# Patient Record
Sex: Female | Born: 1975 | Race: White | Hispanic: No | Marital: Married | Smoking: Never smoker
Health system: Southern US, Community
[De-identification: ages and names within clinical notes are randomized; demographics above are authoritative.]

## PROBLEM LIST (undated history)

## (undated) ENCOUNTER — Inpatient Hospital Stay (HOSPITAL_COMMUNITY): Payer: Self-pay

## (undated) DIAGNOSIS — Z789 Other specified health status: Secondary | ICD-10-CM

## (undated) HISTORY — PX: TONSILLECTOMY AND ADENOIDECTOMY: SHX28

## (undated) HISTORY — PX: TYMPANOSTOMY TUBE PLACEMENT: SHX32

## (undated) HISTORY — PX: OTHER SURGICAL HISTORY: SHX169

---

## 1998-08-11 ENCOUNTER — Other Ambulatory Visit: Admission: RE | Admit: 1998-08-11 | Discharge: 1998-08-11 | Payer: Self-pay | Admitting: Family Medicine

## 1999-10-12 ENCOUNTER — Other Ambulatory Visit: Admission: RE | Admit: 1999-10-12 | Discharge: 1999-10-12 | Payer: Self-pay | Admitting: Gynecology

## 2000-10-30 ENCOUNTER — Other Ambulatory Visit: Admission: RE | Admit: 2000-10-30 | Discharge: 2000-10-30 | Payer: Self-pay | Admitting: Gynecology

## 2001-01-17 ENCOUNTER — Observation Stay (HOSPITAL_COMMUNITY): Admission: AD | Admit: 2001-01-17 | Discharge: 2001-01-17 | Payer: Self-pay | Admitting: Gynecology

## 2001-06-26 ENCOUNTER — Inpatient Hospital Stay (HOSPITAL_COMMUNITY): Admission: AD | Admit: 2001-06-26 | Discharge: 2001-06-26 | Payer: Self-pay | Admitting: *Deleted

## 2001-07-19 ENCOUNTER — Inpatient Hospital Stay (HOSPITAL_COMMUNITY): Admission: AD | Admit: 2001-07-19 | Discharge: 2001-07-21 | Payer: Self-pay | Admitting: *Deleted

## 2001-08-30 ENCOUNTER — Other Ambulatory Visit: Admission: RE | Admit: 2001-08-30 | Discharge: 2001-08-30 | Payer: Self-pay | Admitting: Gynecology

## 2002-09-03 ENCOUNTER — Other Ambulatory Visit: Admission: RE | Admit: 2002-09-03 | Discharge: 2002-09-03 | Payer: Self-pay | Admitting: Gynecology

## 2003-03-10 ENCOUNTER — Emergency Department (HOSPITAL_COMMUNITY): Admission: EM | Admit: 2003-03-10 | Discharge: 2003-03-10 | Payer: Self-pay | Admitting: Emergency Medicine

## 2003-03-10 ENCOUNTER — Encounter: Payer: Self-pay | Admitting: Emergency Medicine

## 2003-12-18 ENCOUNTER — Inpatient Hospital Stay (HOSPITAL_COMMUNITY): Admission: AD | Admit: 2003-12-18 | Discharge: 2003-12-21 | Payer: Self-pay | Admitting: Obstetrics and Gynecology

## 2004-02-10 ENCOUNTER — Other Ambulatory Visit: Admission: RE | Admit: 2004-02-10 | Discharge: 2004-02-10 | Payer: Self-pay | Admitting: Obstetrics and Gynecology

## 2005-02-28 ENCOUNTER — Other Ambulatory Visit: Admission: RE | Admit: 2005-02-28 | Discharge: 2005-02-28 | Payer: Self-pay | Admitting: Obstetrics and Gynecology

## 2006-05-20 IMAGING — US US OB LIMITED
1 series · 14 of 23 positions shown · non-contrast
Comparison: none

CLINICAL DATA: 28-year-old female at reported assigned gestational age of 38 weeks 6 days.  Status post fall, striking abdomen.  Evaluate placenta and AFI.

[Series 1: unknown · 0.37mm/px · 14 of 23 slices shown]
[im 1/23]
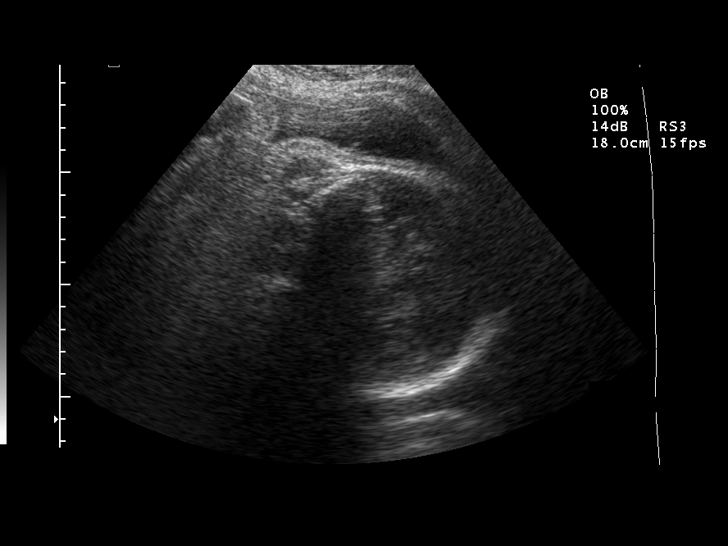
[im 3/23]
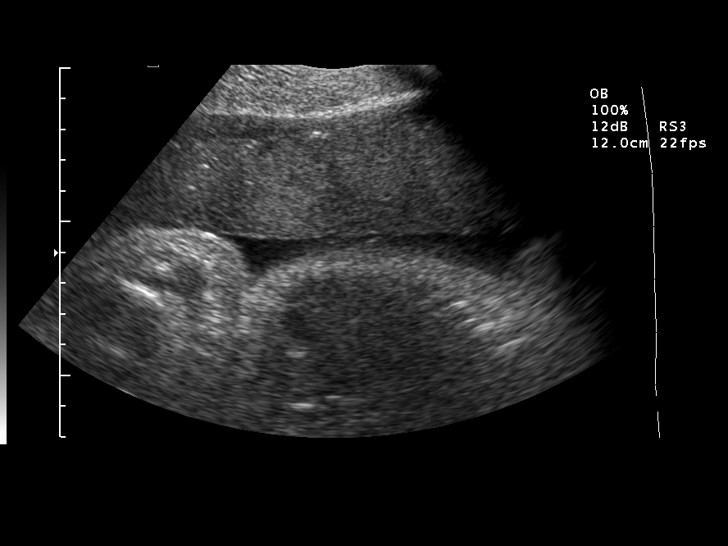
[im 5/23]
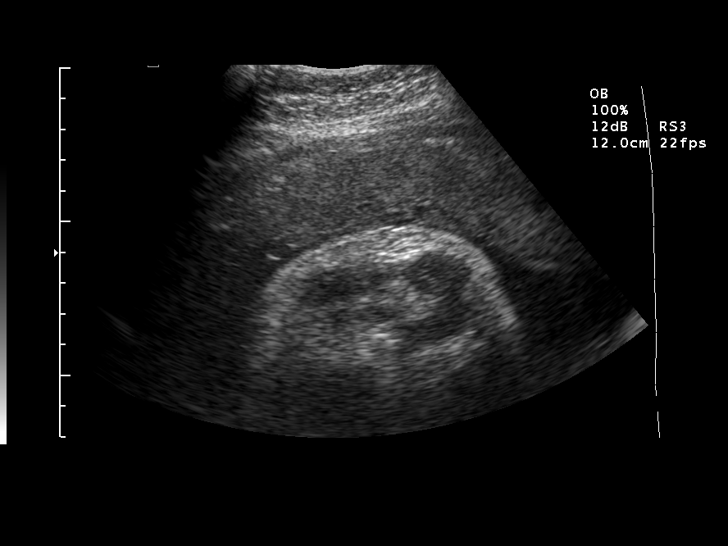
[im 6/23]
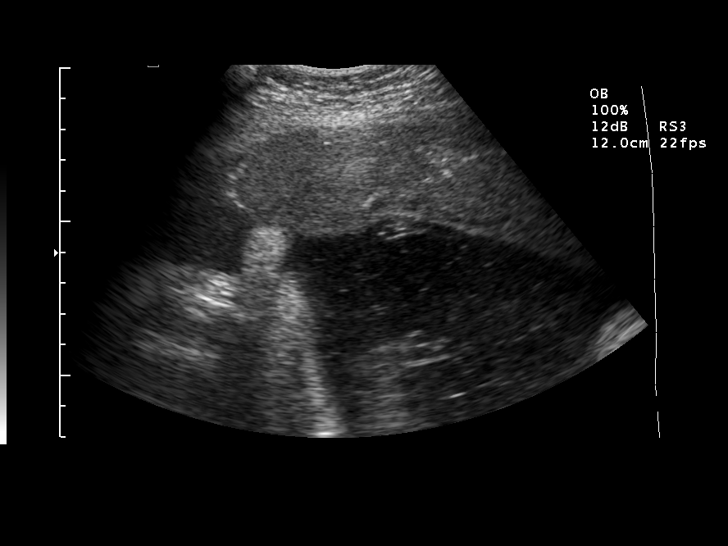
[im 8/23]
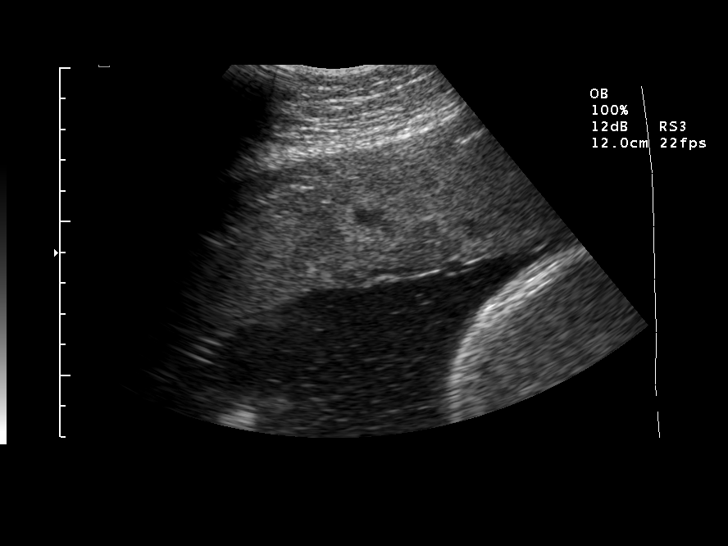
[im 10/23]
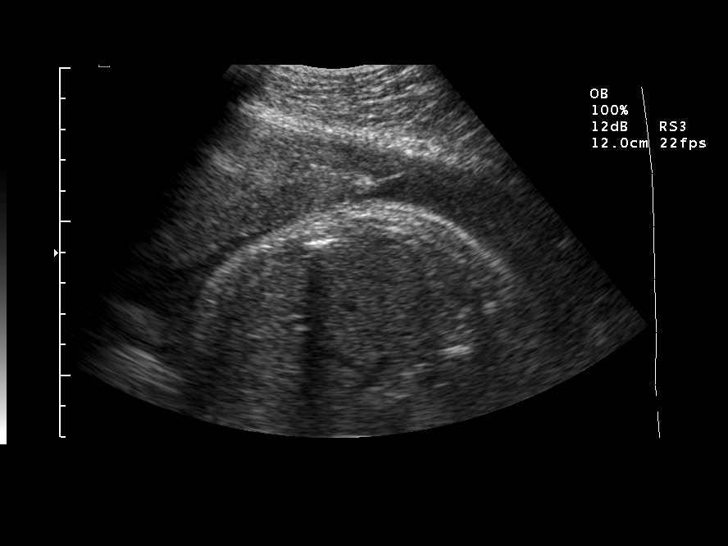
[im 11/23]
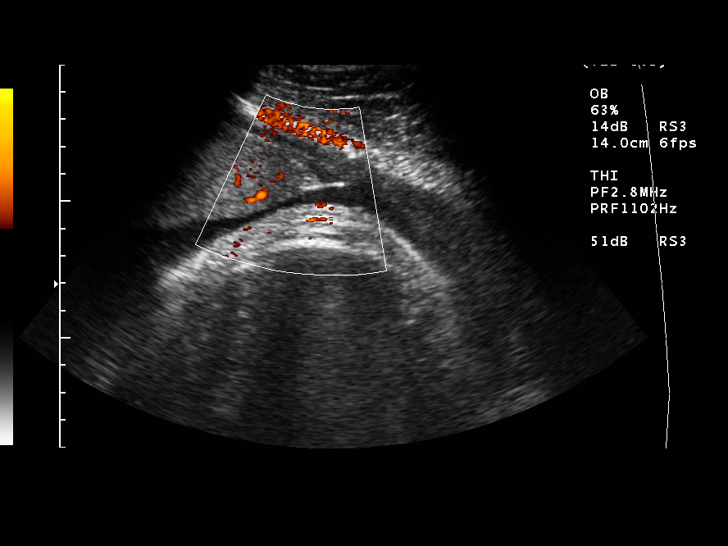
[im 13/23]
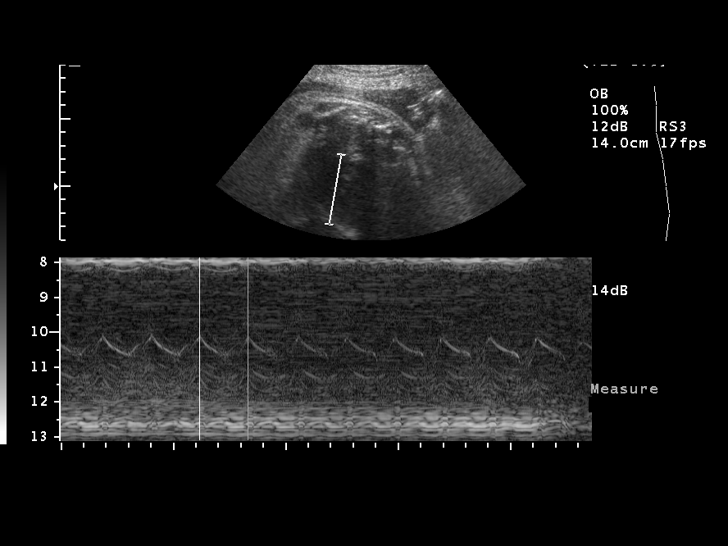
[im 14/23]
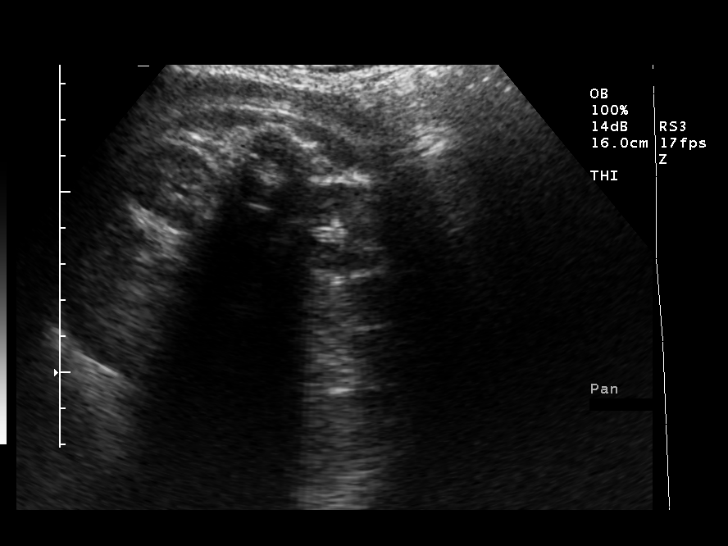
[im 16/23]
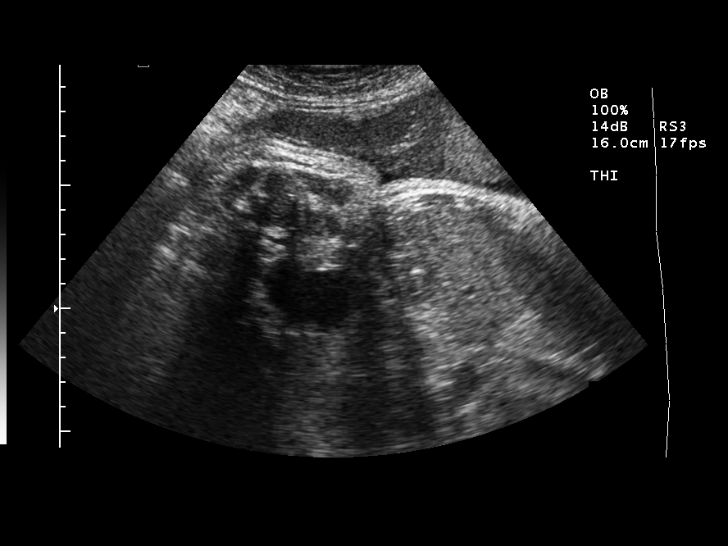
[im 18/23]
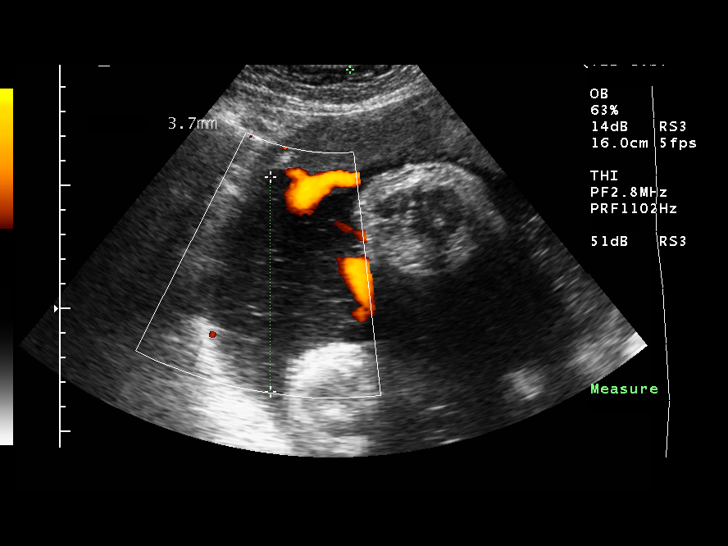
[im 19/23]
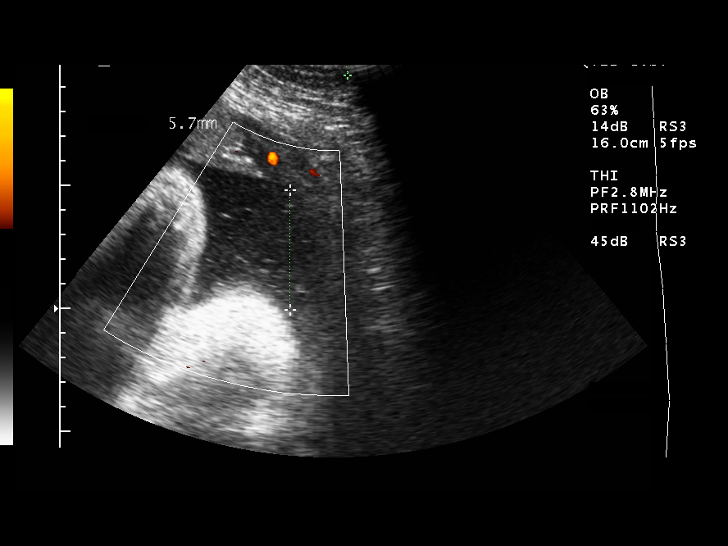
[im 21/23]
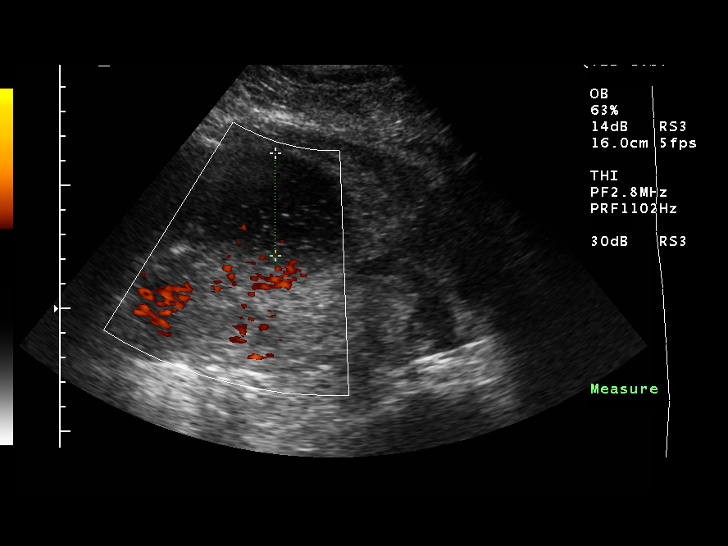
[im 23/23]
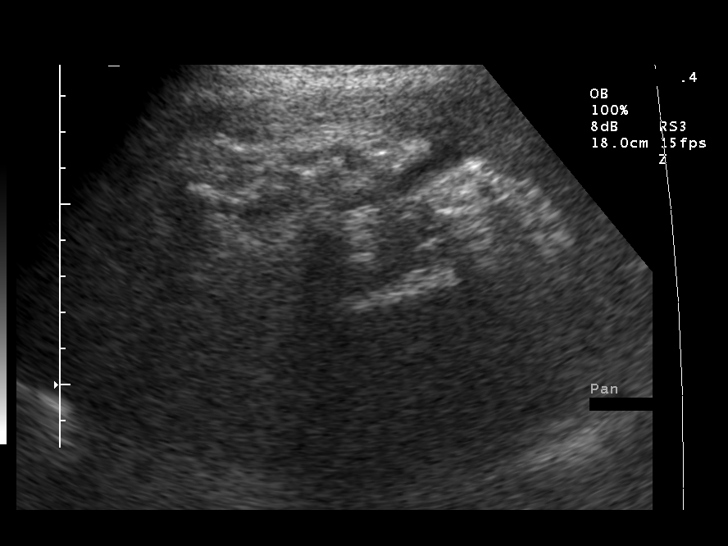

[14 of 23 positions shown; findings below may reference images not displayed]

LIMITED OBSTETRICAL ULTRASOUND
 Number of Fetuses:  1
 Heart Rate:  140 BPM
 Movement:  Yes
 Breathing:  Yes
 Presentation:  Cephalic
 Placental Location:  Anterior 
 Grade:  II
 Previa:  No
 Amniotic Fluid (Subjective):  Normal
 Amniotic Fluid (Objective):  AFI 20.9 cm (3th-93th %ile = 7.2 to 22.6 cm for 39 weeks) 

 Fetal measurements and complete anatomic evaluation were not requested.  The following fetal anatomy was visualized during this exam:  Stomach, three vessel cord, kidneys, and bladder.

 MATERNAL FINDINGS
 Cervix: Not evaluated, >37 weeks.
IMPRESSION: Single living intrauterine fetus in cephalic presentation with subjectively and quantitatively normal amniotic fluid volume.
 No focal placental abnormality is identified.  Placental abruption can be occult at ultrasound.

## 2010-05-28 ENCOUNTER — Encounter: Payer: Self-pay | Admitting: Family Medicine

## 2010-05-28 LAB — CONVERTED CEMR LAB: Pap Smear: ABNORMAL

## 2010-06-25 ENCOUNTER — Encounter: Payer: Self-pay | Admitting: Family Medicine

## 2010-06-25 ENCOUNTER — Ambulatory Visit (INDEPENDENT_AMBULATORY_CARE_PROVIDER_SITE_OTHER): Payer: BC Managed Care – PPO | Admitting: Family Medicine

## 2010-06-25 DIAGNOSIS — K219 Gastro-esophageal reflux disease without esophagitis: Secondary | ICD-10-CM | POA: Insufficient documentation

## 2010-06-25 DIAGNOSIS — R030 Elevated blood-pressure reading, without diagnosis of hypertension: Secondary | ICD-10-CM | POA: Insufficient documentation

## 2010-06-25 DIAGNOSIS — R109 Unspecified abdominal pain: Secondary | ICD-10-CM | POA: Insufficient documentation

## 2010-06-25 DIAGNOSIS — Z8719 Personal history of other diseases of the digestive system: Secondary | ICD-10-CM | POA: Insufficient documentation

## 2010-06-25 DIAGNOSIS — E663 Overweight: Secondary | ICD-10-CM

## 2010-06-25 DIAGNOSIS — J069 Acute upper respiratory infection, unspecified: Secondary | ICD-10-CM | POA: Insufficient documentation

## 2010-06-25 DIAGNOSIS — Z862 Personal history of diseases of the blood and blood-forming organs and certain disorders involving the immune mechanism: Secondary | ICD-10-CM | POA: Insufficient documentation

## 2010-06-25 DIAGNOSIS — Z8522 Personal history of malignant neoplasm of nasal cavities, middle ear, and accessory sinuses: Secondary | ICD-10-CM | POA: Insufficient documentation

## 2010-06-25 DIAGNOSIS — R1013 Epigastric pain: Secondary | ICD-10-CM

## 2010-06-25 DIAGNOSIS — K649 Unspecified hemorrhoids: Secondary | ICD-10-CM | POA: Insufficient documentation

## 2010-06-25 LAB — CONVERTED CEMR LAB
Beta hcg, urine, semiquantitative: NEGATIVE
Glucose, Urine, Semiquant: NEGATIVE
Nitrite: NEGATIVE
Specific Gravity, Urine: 1.02
Urobilinogen, UA: 0.2
WBC Urine, dipstick: NEGATIVE
pH: 6

## 2010-06-28 ENCOUNTER — Encounter: Payer: Self-pay | Admitting: Family Medicine

## 2010-06-28 LAB — CONVERTED CEMR LAB
ALT: <8 units/L (ref 0–35)
AST: 12 units/L (ref 0–37)
Albumin: 4.2 g/dL (ref 3.5–5.2)
Alkaline Phosphatase: 75 units/L (ref 39–117)
BUN: 9 mg/dL (ref 6–23)
Basophils Absolute: 0 10*3/uL (ref 0.0–0.1)
Basophils Relative: 0 % (ref 0–1)
Bilirubin, Direct: 0.1 mg/dL (ref 0.0–0.3)
CO2: 24 meq/L (ref 19–32)
Calcium: 9.4 mg/dL (ref 8.4–10.5)
Chloride: 105 meq/L (ref 96–112)
Cholesterol: 171 mg/dL (ref 0–200)
Creatinine, Ser: 0.78 mg/dL (ref 0.40–1.20)
Eosinophils Absolute: 0 10*3/uL (ref 0.0–0.7)
Eosinophils Relative: 0 % (ref 0–5)
Glucose, Bld: 86 mg/dL (ref 70–99)
HCT: 41.1 % (ref 36.0–46.0)
HDL: 49 mg/dL (ref >39)
Helicobacter Pylori Antibody-IgG: <0.4
Hemoglobin: 13.8 g/dL (ref 12.0–15.0)
Indirect Bilirubin: 0.3 mg/dL (ref 0.0–0.9)
LDL Cholesterol: 107 mg/dL — ABNORMAL HIGH (ref 0–99)
Lymphocytes Relative: 18 % (ref 12–46)
Lymphs Abs: 1.8 10*3/uL (ref 0.7–4.0)
MCHC: 33.6 g/dL (ref 30.0–36.0)
MCV: 91.9 fL (ref 78.0–100.0)
Monocytes Absolute: 0.6 10*3/uL (ref 0.1–1.0)
Monocytes Relative: 6 % (ref 3–12)
Neutro Abs: 7.5 10*3/uL (ref 1.7–7.7)
Neutrophils Relative %: 76 % (ref 43–77)
Platelets: 317 10*3/uL (ref 150–400)
Potassium: 4.9 meq/L (ref 3.5–5.3)
RBC: 4.47 M/uL (ref 3.87–5.11)
RDW: 12.8 % (ref 11.5–15.5)
Sodium: 140 meq/L (ref 135–145)
TSH: 1.531 microintl units/mL (ref 0.350–4.500)
Total Bilirubin: 0.4 mg/dL (ref 0.3–1.2)
Total CHOL/HDL Ratio: 3.5
Total Protein: 7 g/dL (ref 6.0–8.3)
Triglycerides: 77 mg/dL (ref <150)
VLDL: 15 mg/dL (ref 0–40)
WBC: 9.9 10*3/uL (ref 4.0–10.5)

## 2010-06-29 ENCOUNTER — Encounter: Payer: Self-pay | Admitting: *Deleted

## 2010-07-07 NOTE — Letter (Signed)
Summary: Generic Letter  [place] [facility]   [place], [place] [address]   Phone: [phone]  Fax: [phone]    06/28/2010  Catherine Reeves [address] [place], [place]  [address]  [place]   Dear Ms. Catherine Reeves,  Our office has been unsuccessful with reaching you. The MD would like you to know that all your lab results came back normal except there was mild elevation in your cholesterol. MD suggests that you avoid trans fats and consider a fiber supplement. We would like you to recheck your labs in a year.   Please call us if you have any question at [phone].      Sincerely,   Josph Macho RMA

## 2010-07-07 NOTE — Assessment & Plan Note (Signed)
Vital Signs:  Patient profile:   35 year old female Menstrual status:  regular LMP:     05/31/2010 Height:      69.25 inches (175.90 cm) Weight:      227.50 pounds (103.41 kg) BMI:     33.47 O2 Sat:      100 % on Room air Temp:     98.2 degrees F (36.78 degrees C) oral Pulse rate:   73 / minute BP sitting:   142 / 85  (right arm) Cuff size:   regular  Vitals Entered By: Josph Macho RMA (June 25, 2010 11:24 AM)  O2 Flow:  Room air  Serial Vital Signs/Assessments:  Time      Position  BP       Pulse  Resp  Temp     By                     128/80                         Danise Edge MD  CC: Establish new patient/ Under breast to top of legs feel "weird" X 5 days- little cough, bloated/ CF Is Patient Diabetic? No LMP (date): 05/31/2010     Menstrual Status regular Enter LMP: 05/31/2010 Last PAP Result abnormal   History of Present Illness: is a 35 year old Caucasian female in today for an appointment. She is treated a large because of some terminal discomfort. She reports just feeling what she describes as severe it for about 5 days now. She describes a long history of intermittent diarrhea constipation bowel type symptoms. Should symptoms are worse with stress traveling or dietary changes. She's never quite expensive the level of bloating and discomfort she is experiencing now. She did have several loose stool yesterday none today describes as small scant amount of bright blood on the tissue when she wipes she thinks secondary to hemorrhoids she's had since having children. She's had trouble with them off and on relatively infrequently she is experiencing some mild amount of anorexia the last few days which is also somewhat new but denies any nausea, vomiting, fevers, chills. Does acknowledge some left upper quadrant and epigastric discomfort notable over the last few days. She's also complaining of a cough and congestion over the last several days. Cough is nonproductive  and dry, she denies sore throat ear pain. She denies any headache but does have some mild nasal congestion without rhinorrhea. She has taken some OTC cold preparations she believes contain Sudafed over th past several days but has not noted any improvement in symptoms. She denied chest pain, palpitations, shortness of breath, GU complaints at this time. She previously has seen Dr. Deidre Ala physician for women and is maintained on Loestrin 24.  Preventive Screening-Counseling & Management  Alcohol-Tobacco     Smoking Status: never  Caffeine-Diet-Exercise     Does Patient Exercise: yes      Drug Use:  no.    Current Medications (verified): 1)  Loestrin 24 Fe 1-20 Mg-Mcg Tabs (Norethin Ace-Eth Estrad-Fe) .... Once Daily  Allergies (verified): No Known Drug Allergies  Past History:  Past Surgical History: tympanostomy tubes b/l x 3 sets Tonsillectomy and Adendoidectomy Jaw surgery, correction  Family History: Father: 30, colon polyps possibly Mother: 22, A&W, migraines Siblings:  Brother: 71, A&W, migraines Sister: 73, migraines MGM: 21, thyroid disease, HTN, anxiety, dementia MGF: deceased@77 , arthritis, complications from THR, HTN PGM: mid 36s, A&W PGF:  deceased@72 , Hodgkin's Lymphoma, colon polyps Children: daughter: 31yo, A&W son: 6yo, A&W  Social History: Occupation: Therapist, music, Home Schooling Married Never Smoked Alcohol use-yes, rare, special occasions Drug use-no Regular exercise-yes, walks dogs, volleyball, Zumba Wears seat belt No dietary restrictions Smoking Status:  never Occupation:  employed Drug Use:  no Does Patient Exercise:  yes  Review of Systems       The patient complains of anorexia and abdominal pain.  The patient denies fever, weight loss, weight gain, vision loss, decreased hearing, hoarseness, chest pain, syncope, dyspnea on exertion, peripheral edema, prolonged cough, headaches, hemoptysis, melena, hematochezia, severe  indigestion/heartburn, hematuria, incontinence, muscle weakness, suspicious skin lesions, transient blindness, difficulty walking, depression, unusual weight change, abnormal bleeding, and enlarged lymph nodes.    Physical Exam  General:  Well-developed,well-nourished,in no acute distress; alert,appropriate and cooperative throughout examination Head:  Normocephalic and atraumatic without obvious abnormalities. No apparent alopecia or balding. Eyes:  No corneal or conjunctival inflammation noted. EOMI. Perrla. Funduscopic exam benign, without hemorrhages, exudates or papilledema. Vision grossly normal. Ears:  External ear exam shows no significant lesions or deformities.  Otoscopic examination reveals clear canals, tympanic membranes are intact bilaterally without bulging, retraction, inflammation or discharge. Hearing is grossly normal bilaterally. Nose:  External nasal examination shows no deformity or inflammation. Nasal mucosa are pink and moist without lesions or exudates. Mouth:  Oral mucosa and oropharynx without lesions or exudates.  Teeth in good repair. Neck:  No deformities, masses, or tenderness noted. Lungs:  Normal respiratory effort, chest expands symmetrically. Lungs are clear to auscultation, no crackles or wheezes. Heart:  Normal rate and regular rhythm. S1 and S2 normal without gallop, murmur, click, rub or other extra sounds. Abdomen:  Bowel sounds positive,abdomen soft and non-tender without masses, organomegaly or hernias noted. Msk:  No deformity or scoliosis noted of thoracic or lumbar spine.   Pulses:  R and L carotid,dorsalis pedis and posterior tibial pulses are full and equal bilaterally Extremities:  No clubbing, cyanosis, edema, or deformity noted with normal full range of motion of all joints.   Neurologic:  No cranial nerve deficits noted. Station and gait are normal. Plantar reflexes are down-going bilaterally. DTRs are symmetrical throughout. Sensory, motor and  coordinative functions appear intact. Skin:  Intact without suspicious lesions or rashes Cervical Nodes:  No lymphadenopathy noted Psych:  Cognition and judgment appear intact. Alert and cooperative with normal attention span and concentration. No apparent delusions, illusions, hallucinations   Impression & Recommendations:  Problem # 1:  ABDOMINAL PAIN, EPIGASTRIC (ICD-789.06)  Orders: T-CBC w/Diff ([id number]) T-Helicobacter AB - IgG ([id number]) Patient with a long history of intermittent, alternating diarrhea and constipation. Has had increased abdominal fullness, discomfort for about 5 days now. Yesterday she had 3 loose stool and then noted scant BRB on the tissue when she wiped. She has had trouble with hemorrhoids in past and believes these have flared up. She is asked to start Benefiber 2 tsp by mouth two times a day a probiotic and a yogurt daily and increase her clear fluid intake if no improvement will need a AXR to evaluate for possible low grade constipation. Call if symptoms worsen and consider Ranitidine and Tums as needed for epigastric pain and reflux  Problem # 2:  GERD (ICD-530.81)  Her updated medication list for this problem includes:    Ranitidine Hcl 150 Mg Tabs (Ranitidine hcl) .Marland Kitchen... 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   Problem # 4:  OVERWEIGHT (ICD-278.02)  Check a TSH, encouraged her to avoid trans fats, increase exercise, use lean proteins and complex carbs, eat small frequent meals and get 7-8 hours of sleep nightly.  Problem # 5:  Preventive Health Care (ICD-V70.0) Patient has seen Dr Deidre Ala, MD of OB/GYN in past she is encouraged to continue with paps every 1-2 years at that location or here. Patient sent for fasting labs today to establish a baseline and we will  discuss at next visit.  Problem # 6:  ELEVATED BP READING WITHOUT DX HYPERTENSION (ICD-796.2) Improved on repeat but has been taking some OTC cold preparations with Sudafed in them, she is encouraged to avoid Sudafed and Dextromethorphan and we will recheck  bp at next visit.  Problem # 7:  VIRAL URI (ICD-465.9)  Her updated medication list for this problem includes:    Mucinex 600 Mg Xr12h-tab (Guaifenesin) .Marland Kitchen... 1 tab by mouth two times a day x 10 Mild symptoms. increase clear fluids and rest, use Tylenol or Ibuprofen as needed for pain or fever and call if symptoms worsen  Complete Medication List: 1)  Loestrin 24 Fe 1-20 Mg-mcg Tabs (Norethin ace-eth estrad-fe) .... Once daily 2)  Mucinex 600 Mg Xr12h-tab (Guaifenesin) .Marland Kitchen.. 1 tab by mouth two times a day x 10 3)  Ranitidine Hcl 150 Mg Tabs (Ranitidine hcl) .Marland Kitchen.. 1 tab by mouth two times a day as needed reflux  Other Orders: T-Basic Metabolic Panel [id number]) T-Hepatic Function [phone]) T-Lipid Profile [phone]) T-TSH [phone]) Urine Pregnancy Test  (28413) UA Dipstick w/o Micro (manual) (24401)  Patient Instructions: 1)  Please schedule a follow-up appointment in 2 months or sooner if not improving 2)  Avoid fatty and spicy foods in combination especially near bedtime Prescriptions: RANITIDINE HCL 150 MG TABS (RANITIDINE HCL) 1 tab by mouth two times a day as needed reflux  #60 x 1   Entered and Authorized by:   Danise Edge MD   Signed by:   Danise Edge MD on 06/25/2010   Method used:   Faxed to ...       [facility]  [address]  [phone] (retail)       [address]       [place], [place]  [address]       Ph: [phone] or [phone]       Fax: [phone]   RxID:   [id number]    Orders Added: 1)  T-Basic Metabolic Panel [id number] 2)  T-Hepatic Function [phone] 3)  T-Lipid Profile [[id number]] 4)  T-CBC w/Diff [[id number]] 5)  T-Helicobacter AB - IgG [[id number]] 6)  T-TSH [62694-85462] 7)   Urine Pregnancy Test  [81025] 8)  UA Dipstick w/o Micro (manual) [81002] 9)  New Patient Level IV [99204]    Preventive Care Screening  Pap Smear:    Date:  05/28/2010    Results:  abnormal   Last Tetanus Booster:    Date:  05/16/1993    Results:  Historical    Laboratory Results   Urine Tests    Routine Urinalysis   Color: yellow Appearance: Clear Glucose: negative   (Normal Range: Negative) Bilirubin: small   (Normal Range: Negative) Ketone: small (15)   (Normal Range: Negative) Spec. Gravity: 1.020   (Normal Range: 1.003-1.035) Blood: moderate   (Normal Range: Negative) pH: 6.0   (Normal Range:  5.0-8.0) Protein: trace   (Normal Range: Negative) Urobilinogen: 0.2   (Normal Range: 0-1) Nitrite: negative   (Normal Range: Negative) Leukocyte Esterace: negative   (Normal Range: Negative)    Urine HCG: negative

## 2011-05-17 NOTE — L&D Delivery Note (Signed)
Delivery Note At 11:19 AM a viable female was delivered via Vaginal, Spontaneous Delivery OA presentation Apgars 9 9   Placenta status: intact  Cord:  with the following complications: none Cord pH: not obtained  Anesthesia: Epidural  Episiotomy: none Lacerations: first Suture Repair: 3.0 chromic Est. Blood Loss (mL): 300  Mom to postpartum.  Baby to nursery-stable.  Naija Troost L 01/06/2012, 11:28 AM

## 2011-06-06 ENCOUNTER — Other Ambulatory Visit: Payer: Self-pay

## 2011-06-06 LAB — OB RESULTS CONSOLE RUBELLA ANTIBODY, IGM: Rubella: IMMUNE

## 2011-06-06 LAB — OB RESULTS CONSOLE RPR: RPR: NONREACTIVE

## 2011-06-06 LAB — OB RESULTS CONSOLE ABO/RH: RH Type: NEGATIVE

## 2011-06-06 LAB — OB RESULTS CONSOLE HIV ANTIBODY (ROUTINE TESTING): HIV: NONREACTIVE

## 2011-06-06 LAB — OB RESULTS CONSOLE HEPATITIS B SURFACE ANTIGEN: Hepatitis B Surface Ag: NEGATIVE

## 2011-06-13 LAB — OB RESULTS CONSOLE GC/CHLAMYDIA: Chlamydia: NEGATIVE

## 2011-09-07 ENCOUNTER — Other Ambulatory Visit (HOSPITAL_COMMUNITY): Payer: Self-pay | Admitting: Obstetrics and Gynecology

## 2011-09-07 DIAGNOSIS — O2690 Pregnancy related conditions, unspecified, unspecified trimester: Secondary | ICD-10-CM

## 2011-09-09 ENCOUNTER — Encounter (HOSPITAL_COMMUNITY): Payer: Self-pay

## 2011-09-09 ENCOUNTER — Ambulatory Visit (HOSPITAL_COMMUNITY)
Admission: RE | Admit: 2011-09-09 | Discharge: 2011-09-09 | Disposition: A | Discharge status: Home / Self Care | Payer: PRIVATE HEALTH INSURANCE | Source: Ambulatory Visit | Attending: Obstetrics and Gynecology | Admitting: Obstetrics and Gynecology

## 2011-09-09 VITALS — BP 120/66 | HR 87 | Wt 237.0 lb

## 2011-09-09 DIAGNOSIS — O09529 Supervision of elderly multigravida, unspecified trimester: Secondary | ICD-10-CM | POA: Insufficient documentation

## 2011-09-09 DIAGNOSIS — Z363 Encounter for antenatal screening for malformations: Secondary | ICD-10-CM | POA: Insufficient documentation

## 2011-09-09 DIAGNOSIS — Z1389 Encounter for screening for other disorder: Secondary | ICD-10-CM | POA: Insufficient documentation

## 2011-09-09 DIAGNOSIS — O358XX0 Maternal care for other (suspected) fetal abnormality and damage, not applicable or unspecified: Secondary | ICD-10-CM | POA: Insufficient documentation

## 2011-09-09 DIAGNOSIS — O2690 Pregnancy related conditions, unspecified, unspecified trimester: Secondary | ICD-10-CM

## 2011-09-09 NOTE — Progress Notes (Signed)
Catherine Reeves was seen for ultrasound appointment today.  Please see [facility]OB[facility] report for details.

## 2011-10-20 ENCOUNTER — Other Ambulatory Visit (HOSPITAL_COMMUNITY): Payer: Self-pay | Admitting: Obstetrics and Gynecology

## 2011-10-20 DIAGNOSIS — R1011 Right upper quadrant pain: Secondary | ICD-10-CM

## 2011-10-24 ENCOUNTER — Ambulatory Visit (HOSPITAL_COMMUNITY)
Admission: RE | Admit: 2011-10-24 | Discharge: 2011-10-24 | Disposition: A | Discharge status: Home / Self Care | Payer: PRIVATE HEALTH INSURANCE | Source: Ambulatory Visit | Attending: Obstetrics and Gynecology | Admitting: Obstetrics and Gynecology

## 2011-10-24 DIAGNOSIS — R1011 Right upper quadrant pain: Secondary | ICD-10-CM | POA: Insufficient documentation

## 2011-10-24 DIAGNOSIS — O99891 Other specified diseases and conditions complicating pregnancy: Secondary | ICD-10-CM | POA: Insufficient documentation

## 2011-12-13 ENCOUNTER — Encounter (HOSPITAL_COMMUNITY): Payer: Self-pay

## 2011-12-13 ENCOUNTER — Inpatient Hospital Stay (HOSPITAL_COMMUNITY)
Admission: AD | Admit: 2011-12-13 | Discharge: 2011-12-13 | Disposition: A | Discharge status: Home / Self Care | Payer: PRIVATE HEALTH INSURANCE | Source: Ambulatory Visit | Attending: Obstetrics and Gynecology | Admitting: Obstetrics and Gynecology

## 2011-12-13 DIAGNOSIS — O368190 Decreased fetal movements, unspecified trimester, not applicable or unspecified: Secondary | ICD-10-CM | POA: Insufficient documentation

## 2011-12-13 HISTORY — DX: Other specified health status: Z78.9

## 2011-12-13 NOTE — MAU Provider Note (Signed)
Chief Complaint:  Decreased fetal movement   First Provider Initiated Contact with Patient 12/13/11 0703      Catherine Reeves is  36 y.o. [id number].  Patient's last menstrual period was 04/01/2011..  [redacted]w[redacted]d Presents with report of not fetal movement since 10pm last night. Reports multiple movements since arriving to MAU this morning. Denies contractions, bleeding, or LOF.   Obstetrical/Gynecological History: OB History    Grav Para Term Preterm Abortions TAB SAB Ect Mult Living   3 2 2  0 0 0 0 0 0 2      Past Medical History: Past Medical History  Diagnosis Date  . No pertinent past medical history     Past Surgical History: Past Surgical History  Procedure Date  . Tympanostomy tube placement     B/L X 3 sets  . Tonsillectomy and adenoidectomy   . Jaw surgery, correction     Family History: Family History  Problem Relation Age of Onset  . Migraines Mother   . Colon polyps Father     ? Possibly  . Migraines Sister   . Migraines Brother   . Thyroid disease Maternal Grandmother   . Hypertension Maternal Grandmother   . Anxiety disorder Maternal Grandmother   . Dementia Maternal Grandmother   . Arthritis Maternal Grandfather   . Hypertension Maternal Grandfather   . Other Maternal Grandfather     Complications from THR  . Cancer Paternal Grandfather     Hodgkin's Lymphoma  . Colon polyps Paternal Grandfather     Social History: History  Substance Use Topics  . Smoking status: Never Smoker   . Smokeless tobacco: Not on file  . Alcohol Use: 0.0 oz/week    0 drink(s) per week     rare, special occasions    Allergies: No Known Allergies  Prescriptions prior to admission  Medication Sig Dispense Refill  . diphenhydrAMINE (BENADRYL) 25 MG tablet Take 50 mg by mouth every 6 (six) hours as needed.      Marland Kitchen PRENATAL VITAMINS PO Take by mouth.      . ranitidine (ZANTAC) 150 MG tablet Take 150 mg by mouth 2 (two) times daily as needed. For reflux        . guaiFENesin  (MUCINEX) 600 MG 12 hr tablet Take 1,200 mg by mouth 2 (two) times daily. X 10 days       . norethindrone-ethinyl estradiol (MICROGESTIN 1/20) 1-20 MG-MCG per tablet Take 1 tablet by mouth daily.          Review of Systems - Negative except what has been reviewed in the HPI  Physical Exam   Blood pressure 120/68, pulse 68, temperature 98 F (36.7 C), temperature source Oral, resp. rate 22, height 5\' 11"  (1.803 m), weight 215 lb (97.523 kg), last menstrual period 04/01/2011.  General: General appearance - alert, well appearing, and in no distress and oriented to person, place, and time Mental status - alert, oriented to person, place, and time, normal mood, behavior, speech, dress, motor activity, and thought processes, affect appropriate to mood Abdomen - gravid, non tender, fetal movements palpated Focused Gynecological Exam: examination not indicated FHT: cat I tracing Toco: UI  MD Consult: Discussed with Dr. Vincente Poli  Assessment: Decreased fetal movement: resolved  Plan: Discharge home Fetal Kick Counts FU in office as scheduled  Jernard Reiber E. 12/13/2011,7:05 AM

## 2011-12-13 NOTE — MAU Note (Signed)
PT SAYS SHE HAS NOT FELT BABY  MOVE SINCE Monday NIGHT AT 2230.   IN TRIAGE - FHR-150- WITH KICKS ON CARDIO.Marland Kitchen     VE IN OFFICE - CLOSED.- YESTERDAY.    DENIES HSV AND MRSA.

## 2012-01-05 ENCOUNTER — Encounter (HOSPITAL_COMMUNITY): Payer: Self-pay

## 2012-01-05 ENCOUNTER — Inpatient Hospital Stay (HOSPITAL_COMMUNITY)
Admission: RE | Admit: 2012-01-05 | Discharge: 2012-01-07 | DRG: 775 | Disposition: A | Discharge status: Home / Self Care | Payer: PRIVATE HEALTH INSURANCE | Source: Ambulatory Visit | Attending: Obstetrics and Gynecology | Admitting: Obstetrics and Gynecology

## 2012-01-05 DIAGNOSIS — O09529 Supervision of elderly multigravida, unspecified trimester: Secondary | ICD-10-CM | POA: Diagnosis present

## 2012-01-05 LAB — CBC
Hemoglobin: 11.5 g/dL — ABNORMAL LOW (ref 12.0–15.0)
MCH: 31.3 pg (ref 26.0–34.0)
Platelets: 206 10*3/uL (ref 150–400)
RBC: 3.67 MIL/uL — ABNORMAL LOW (ref 3.87–5.11)
WBC: 12.4 10*3/uL — ABNORMAL HIGH (ref 4.0–10.5)

## 2012-01-05 LAB — TYPE AND SCREEN
ABO/RH(D): A NEG
Antibody Screen: NEGATIVE

## 2012-01-05 LAB — ABO/RH: ABO/RH(D): A NEG

## 2012-01-05 MED ORDER — CITRIC ACID-SODIUM CITRATE 334-500 MG/5ML PO SOLN: 30.0000 mL | ORAL | Status: DC | PRN

## 2012-01-05 MED ORDER — IBUPROFEN 600 MG PO TABS: 600.0000 mg | ORAL_TABLET | Freq: Four times a day (QID) | ORAL | Status: DC | PRN

## 2012-01-05 MED ORDER — MISOPROSTOL 25 MCG QUARTER TABLET
25.0000 ug | ORAL_TABLET | ORAL | Status: DC | PRN
  Administered 2012-01-06: 25 ug via VAGINAL
  Filled 2012-01-05: qty 0.25

## 2012-01-05 MED ORDER — LACTATED RINGERS IV SOLN
INTRAVENOUS | Status: DC
  Administered 2012-01-05 – 2012-01-06 (×3): via INTRAVENOUS

## 2012-01-05 MED ORDER — ACETAMINOPHEN 325 MG PO TABS: 650.0000 mg | ORAL_TABLET | ORAL | Status: DC | PRN

## 2012-01-05 MED ORDER — BUTORPHANOL TARTRATE 1 MG/ML IJ SOLN: 1.0000 mg | INTRAMUSCULAR | Status: DC | PRN

## 2012-01-05 MED ORDER — OXYTOCIN 40 UNITS IN LACTATED RINGERS INFUSION - SIMPLE MED: 62.5000 mL/h | Freq: Once | INTRAVENOUS | Status: DC

## 2012-01-05 MED ORDER — FLEET ENEMA 7-19 GM/118ML RE ENEM: 1.0000 | ENEMA | RECTAL | Status: DC | PRN

## 2012-01-05 MED ORDER — ONDANSETRON HCL 4 MG/2ML IJ SOLN: 4.0000 mg | Freq: Four times a day (QID) | INTRAMUSCULAR | Status: DC | PRN

## 2012-01-05 MED ORDER — OXYCODONE-ACETAMINOPHEN 5-325 MG PO TABS: 1.0000 | ORAL_TABLET | ORAL | Status: DC | PRN

## 2012-01-05 MED ORDER — OXYTOCIN BOLUS FROM INFUSION
250.0000 mL | Freq: Once | INTRAVENOUS | Status: DC
  Filled 2012-01-05: qty 500

## 2012-01-05 MED ORDER — TERBUTALINE SULFATE 1 MG/ML IJ SOLN: 0.2500 mg | Freq: Once | INTRAMUSCULAR | Status: AC | PRN

## 2012-01-05 MED ORDER — LIDOCAINE HCL (PF) 1 % IJ SOLN
30.0000 mL | INTRAMUSCULAR | Status: DC | PRN
  Filled 2012-01-05: qty 30

## 2012-01-05 MED ORDER — LACTATED RINGERS IV SOLN: 500.0000 mL | INTRAVENOUS | Status: DC | PRN

## 2012-01-05 NOTE — H&P (Signed)
Catherine Reeves is a 36 y.o. female presenting for induction of labor at term. No ROM, no HA, no vision change, no epigastric pain. Maternal Medical History:  Fetal activity: Perceived fetal activity is normal.      OB History    Grav Para Term Preterm Abortions TAB SAB Ect Mult Living   3 2 2  0 0 0 0 0 0 2     Past Medical History  Diagnosis Date  . No pertinent past medical history    Past Surgical History  Procedure Date  . Tympanostomy tube placement     B/L X 3 sets  . Tonsillectomy and adenoidectomy   . Jaw surgery, correction    Family History: family history includes Anxiety disorder in her maternal grandmother; Arthritis in her maternal grandfather; Cancer in her paternal grandfather; Colon polyps in her father and paternal grandfather; Dementia in her maternal grandmother; Hypertension in her maternal grandfather and maternal grandmother; Migraines in her brother, mother, and sister; Other in her maternal grandfather; and Thyroid disease in her maternal grandmother. Social History:  reports that she has never smoked. She does not have any smokeless tobacco history on file. She reports that she drinks alcohol. She reports that she does not use illicit drugs.   Prenatal Transfer Tool  Maternal Diabetes: No Genetic Screening: Normal Maternal Ultrasounds/Referrals: Normal Fetal Ultrasounds or other Referrals:  None Maternal Substance Abuse:  No Significant Maternal Medications:  None Significant Maternal Lab Results:  None Other Comments:  None  Review of Systems  Eyes: Negative for blurred vision.  Gastrointestinal: Negative for abdominal pain.  Neurological: Negative for headaches.    Dilation: 3 Effacement (%): 80 Station: -3 Exam by:: Dr Henderson Cloud Blood pressure 126/82, pulse 92, temperature 97.8 F (36.6 C), temperature source Oral, resp. rate 20, height 5\' 10"  (1.778 m), weight 111.585 kg (246 lb), last menstrual period 04/01/2011. Maternal Exam:  Uterine  Assessment: Contraction strength is mild.  Contraction frequency is irregular.   Abdomen: Fetal presentation: vertex     Fetal Exam Fetal Monitor Review: Pattern: accelerations present.       Physical Exam  Cardiovascular: Normal rate and regular rhythm.   Respiratory: Effort normal and breath sounds normal.  GI: There is no tenderness.  Neurological: She has normal reflexes.    Prenatal labs: ABO, Rh: A/Negative/-- (01/21 0000) Antibody: Negative (01/21 0000) Rubella: Immune (01/21 0000) RPR: Nonreactive (01/21 0000)  HBsAg: Negative (01/21 0000)  HIV: Non-reactive (01/21 0000)  GBS: Negative (07/23 0000)   Assessment/Plan: 36 yo G3P2 at 12 6/7 weeks presents for induction of labor at term.  Cx = 2 cm at last check in office. D/W patient potential two stage induction of not in labor, reviewed risks.   Ishan Sanroman II,Jayliah Benett E 01/05/2012, 8:18 PM

## 2012-01-06 ENCOUNTER — Encounter (HOSPITAL_COMMUNITY): Payer: Self-pay

## 2012-01-06 ENCOUNTER — Inpatient Hospital Stay (HOSPITAL_COMMUNITY): Payer: PRIVATE HEALTH INSURANCE | Admitting: Anesthesiology

## 2012-01-06 ENCOUNTER — Encounter (HOSPITAL_COMMUNITY): Payer: Self-pay | Admitting: Anesthesiology

## 2012-01-06 MED ORDER — OXYCODONE-ACETAMINOPHEN 5-325 MG PO TABS: 1.0000 | ORAL_TABLET | ORAL | Status: DC | PRN

## 2012-01-06 MED ORDER — DIPHENHYDRAMINE HCL 50 MG/ML IJ SOLN: 12.5000 mg | INTRAMUSCULAR | Status: DC | PRN

## 2012-01-06 MED ORDER — FLEET ENEMA 7-19 GM/118ML RE ENEM: 1.0000 | ENEMA | Freq: Every day | RECTAL | Status: DC | PRN

## 2012-01-06 MED ORDER — SENNOSIDES-DOCUSATE SODIUM 8.6-50 MG PO TABS
2.0000 | ORAL_TABLET | Freq: Every day | ORAL | Status: DC
  Administered 2012-01-06: 2 via ORAL

## 2012-01-06 MED ORDER — FENTANYL 2.5 MCG/ML BUPIVACAINE 1/10 % EPIDURAL INFUSION (WH - ANES)
14.0000 mL/h | INTRAMUSCULAR | Status: DC
  Administered 2012-01-06: 16 mL/h via EPIDURAL
  Filled 2012-01-06: qty 60

## 2012-01-06 MED ORDER — EPHEDRINE 5 MG/ML INJ
10.0000 mg | INTRAVENOUS | Status: DC | PRN
  Filled 2012-01-06: qty 4

## 2012-01-06 MED ORDER — BENZOCAINE-MENTHOL 20-0.5 % EX AERO
1.0000 "application " | INHALATION_SPRAY | CUTANEOUS | Status: DC | PRN
  Administered 2012-01-06: 1 via TOPICAL
  Filled 2012-01-06: qty 56

## 2012-01-06 MED ORDER — TERBUTALINE SULFATE 1 MG/ML IJ SOLN: 0.2500 mg | Freq: Once | INTRAMUSCULAR | Status: DC | PRN

## 2012-01-06 MED ORDER — EPHEDRINE 5 MG/ML INJ: 10.0000 mg | INTRAVENOUS | Status: DC | PRN

## 2012-01-06 MED ORDER — ZOLPIDEM TARTRATE 5 MG PO TABS: 5.0000 mg | ORAL_TABLET | Freq: Every evening | ORAL | Status: DC | PRN

## 2012-01-06 MED ORDER — PRENATAL MULTIVITAMIN CH
1.0000 | ORAL_TABLET | Freq: Every day | ORAL | Status: DC
  Administered 2012-01-07: 1 via ORAL
  Filled 2012-01-06: qty 1

## 2012-01-06 MED ORDER — MEDROXYPROGESTERONE ACETATE 150 MG/ML IM SUSP: 150.0000 mg | INTRAMUSCULAR | Status: DC | PRN

## 2012-01-06 MED ORDER — LANOLIN HYDROUS EX OINT: TOPICAL_OINTMENT | CUTANEOUS | Status: DC | PRN

## 2012-01-06 MED ORDER — WITCH HAZEL-GLYCERIN EX PADS: 1.0000 "application " | MEDICATED_PAD | CUTANEOUS | Status: DC | PRN

## 2012-01-06 MED ORDER — TETANUS-DIPHTH-ACELL PERTUSSIS 5-2.5-18.5 LF-MCG/0.5 IM SUSP: 0.5000 mL | Freq: Once | INTRAMUSCULAR | Status: DC

## 2012-01-06 MED ORDER — MEASLES, MUMPS & RUBELLA VAC ~~LOC~~ INJ
0.5000 mL | INJECTION | Freq: Once | SUBCUTANEOUS | Status: DC
  Filled 2012-01-06: qty 0.5

## 2012-01-06 MED ORDER — LACTATED RINGERS IV SOLN
500.0000 mL | Freq: Once | INTRAVENOUS | Status: AC
  Administered 2012-01-06: 500 mL via INTRAVENOUS

## 2012-01-06 MED ORDER — SIMETHICONE 80 MG PO CHEW: 80.0000 mg | CHEWABLE_TABLET | ORAL | Status: DC | PRN

## 2012-01-06 MED ORDER — BISACODYL 10 MG RE SUPP: 10.0000 mg | Freq: Every day | RECTAL | Status: DC | PRN

## 2012-01-06 MED ORDER — DIBUCAINE 1 % RE OINT: 1.0000 "application " | TOPICAL_OINTMENT | RECTAL | Status: DC | PRN

## 2012-01-06 MED ORDER — OXYTOCIN 40 UNITS IN LACTATED RINGERS INFUSION - SIMPLE MED
1.0000 m[IU]/min | INTRAVENOUS | Status: DC
  Administered 2012-01-06: 1 m[IU]/min via INTRAVENOUS
  Filled 2012-01-06: qty 1000

## 2012-01-06 MED ORDER — PHENYLEPHRINE 40 MCG/ML (10ML) SYRINGE FOR IV PUSH (FOR BLOOD PRESSURE SUPPORT)
80.0000 ug | PREFILLED_SYRINGE | INTRAVENOUS | Status: DC | PRN
  Filled 2012-01-06: qty 5

## 2012-01-06 MED ORDER — ONDANSETRON HCL 4 MG PO TABS: 4.0000 mg | ORAL_TABLET | ORAL | Status: DC | PRN

## 2012-01-06 MED ORDER — DIPHENHYDRAMINE HCL 25 MG PO CAPS: 25.0000 mg | ORAL_CAPSULE | Freq: Four times a day (QID) | ORAL | Status: DC | PRN

## 2012-01-06 MED ORDER — PHENYLEPHRINE 40 MCG/ML (10ML) SYRINGE FOR IV PUSH (FOR BLOOD PRESSURE SUPPORT): 80.0000 ug | PREFILLED_SYRINGE | INTRAVENOUS | Status: DC | PRN

## 2012-01-06 MED ORDER — IBUPROFEN 600 MG PO TABS
600.0000 mg | ORAL_TABLET | Freq: Four times a day (QID) | ORAL | Status: DC
  Administered 2012-01-06 – 2012-01-07 (×2): 600 mg via ORAL
  Filled 2012-01-06 (×3): qty 1

## 2012-01-06 MED ORDER — OXYTOCIN 40 UNITS IN LACTATED RINGERS INFUSION - SIMPLE MED: 1.0000 m[IU]/min | INTRAVENOUS | Status: DC

## 2012-01-06 MED ORDER — ONDANSETRON HCL 4 MG/2ML IJ SOLN: 4.0000 mg | INTRAMUSCULAR | Status: DC | PRN

## 2012-01-06 MED ORDER — LIDOCAINE HCL (PF) 1 % IJ SOLN
INTRAMUSCULAR | Status: DC | PRN
  Administered 2012-01-06 (×4): 4 mL

## 2012-01-06 NOTE — Anesthesia Preprocedure Evaluation (Signed)
Anesthesia Evaluation  Patient identified by MRN, date of birth, ID band Patient awake    Reviewed: Allergy & Precautions, H&P , NPO status , Patient's Chart, lab work & pertinent test results, reviewed documented beta blocker date and time   History of Anesthesia Complications Negative for: history of anesthetic complications  Airway Mallampati: II TM Distance: >3 FB Neck ROM: full    Dental  (+) Teeth Intact   Pulmonary neg pulmonary ROS,  breath sounds clear to auscultation        Cardiovascular negative cardio ROS  Rhythm:regular Rate:Normal     Neuro/Psych negative neurological ROS  negative psych ROS   GI/Hepatic Neg liver ROS, GERD- (with pregnancy)  Medicated,  Endo/Other  negative endocrine ROS  Renal/GU negative Renal ROS  negative genitourinary   Musculoskeletal   Abdominal   Peds  Hematology negative hematology ROS (+)   Anesthesia Other Findings   Reproductive/Obstetrics (+) Pregnancy                           Anesthesia Physical Anesthesia Plan  ASA: II  Anesthesia Plan: Epidural   Post-op Pain Management:    Induction:   Airway Management Planned:   Additional Equipment:   Intra-op Plan:   Post-operative Plan:   Informed Consent: I have reviewed the patients History and Physical, chart, labs and discussed the procedure including the risks, benefits and alternatives for the proposed anesthesia with the patient or authorized representative who has indicated his/her understanding and acceptance.     Plan Discussed with:   Anesthesia Plan Comments:         Anesthesia Quick Evaluation

## 2012-01-06 NOTE — Anesthesia Postprocedure Evaluation (Signed)
  Anesthesia Post Note  Patient: Catherine Reeves  Procedure(s) Performed: * No procedures listed *  Anesthesia type: Epidural  Patient location: Mother/Baby  Post pain: Pain level controlled  Post assessment: Post-op Vital signs reviewed  Last Vitals:  Filed Vitals:   01/06/12 1430  BP: 108/69  Pulse: 78  Temp: 37 C  Resp: 18    Post vital signs: Reviewed  Level of consciousness:alert  Complications: No apparent anesthesia complications

## 2012-01-06 NOTE — Progress Notes (Signed)
Patient feeling mild UCS FHR is reactive Irregular UCs On low dose pitocin Cervix is 80% 4 cm -2 Vertex AROM CLEAR FLUID EPIDURAL OK

## 2012-01-06 NOTE — Anesthesia Procedure Notes (Signed)
Epidural Patient location during procedure: OB Start time: 01/06/2012 8:52 AM Reason for block: procedure for pain  Staffing Performed by: anesthesiologist   Preanesthetic Checklist Completed: patient identified, site marked, surgical consent, pre-op evaluation, timeout performed, IV checked, risks and benefits discussed and monitors and equipment checked  Epidural Patient position: sitting Prep: site prepped and draped and DuraPrep Patient monitoring: continuous pulse ox and blood pressure Approach: midline Injection technique: LOR air  Needle:  Needle type: Tuohy  Needle gauge: 17 G Needle length: 9 cm Catheter type: closed end flexible Catheter size: 19 Gauge Test dose: negative  Assessment Events: blood not aspirated, injection not painful, no injection resistance, negative IV test and no paresthesia  Additional Notes Discussed risk of headache, infection, bleeding, nerve injury and failed or incomplete block.  Patient voices understanding and wishes to proceed.

## 2012-01-07 LAB — CBC
MCHC: 34.2 g/dL (ref 30.0–36.0)
Platelets: 177 10*3/uL (ref 150–400)
RDW: 13.3 % (ref 11.5–15.5)
WBC: 11.9 10*3/uL — ABNORMAL HIGH (ref 4.0–10.5)

## 2012-01-07 MED ORDER — IBUPROFEN 600 MG PO TABS: 600.0000 mg | ORAL_TABLET | Freq: Four times a day (QID) | ORAL | Status: AC

## 2012-01-07 MED ORDER — OXYCODONE-ACETAMINOPHEN 5-325 MG PO TABS: 1.0000 | ORAL_TABLET | ORAL | Status: AC | PRN

## 2012-01-07 NOTE — Discharge Summary (Signed)
Obstetric Discharge Summary Reason for Admission: induction of labor Prenatal Procedures: none Intrapartum Procedures: spontaneous vaginal delivery Postpartum Procedures: none Complications-Operative and Postpartum: 1st degree perineal laceration Hemoglobin  Date Value Range Status  01/07/2012 10.9* 12.0 - 15.0 g/dL Final     HCT  Date Value Range Status  01/07/2012 31.9* 36.0 - 46.0 % Final    Physical Exam:  General: alert, cooperative and appears stated age Lochia: appropriate Uterine Fundus: firm Incision: perineum intact DVT Evaluation: No evidence of DVT seen on physical exam. Negative Homan's sign. No cords or calf tenderness. No significant calf/ankle edema.  Discharge Diagnoses: Term Pregnancy-delivered  Discharge Information: Date: 01/07/2012 Activity: unrestricted and pelvic rest Diet: routine Medications: PNV, Ibuprofen, Colace and Percocet Condition: stable Instructions: refer to practice specific booklet Discharge to: home   Newborn Data: Live born female  Birth Weight: 7 lb 8.5 oz (3416 g) APGAR: 9, 9  Home with mother.  Starling Christofferson 01/07/2012, 9:08 AM

## 2012-01-07 NOTE — Progress Notes (Signed)
Post Partum Day 1 Subjective: no complaints, up ad lib, voiding, tolerating PO and + flatus. Requests early discharge home today. Requests circumcision.  Objective: Blood pressure 116/73, pulse 66, temperature 98 F (36.7 C), temperature source Oral, resp. rate 16, height 5\' 10"  (1.778 m), weight 111.585 kg (246 lb), last menstrual period 04/01/2011, SpO2 100.00%, unknown if currently breastfeeding.  Physical Exam:  General: alert, cooperative and appears stated age Lochia: appropriate Uterine Fundus: firm Incision: perineum intact DVT Evaluation: No evidence of DVT seen on physical exam. Negative Homan's sign. No cords or calf tenderness. No significant calf/ankle edema.   Basename 01/07/12 0551 01/05/12 2010  HGB 10.9* 11.5*  HCT 31.9* 34.6*    Assessment/Plan: Discharge home and Circumcision prior to discharge   LOS: 2 days   Catherine Reeves 01/07/2012, 9:06 AM

## 2012-01-09 NOTE — Progress Notes (Signed)
Post discharge chart review completed.  

## 2012-01-12 ENCOUNTER — Ambulatory Visit (HOSPITAL_COMMUNITY)
Admission: RE | Admit: 2012-01-12 | Discharge: 2012-01-12 | Disposition: A | Discharge status: Home / Self Care | Payer: PRIVATE HEALTH INSURANCE | Source: Ambulatory Visit | Attending: Obstetrics and Gynecology | Admitting: Obstetrics and Gynecology

## 2012-01-12 NOTE — Progress Notes (Addendum)
Infant Lactation Consultation Outpatient Visit Note  Patient Name: Catherine Reeves Date of Birth: 1976/04/20 Birth Weight:   Gestational Age at Delivery: Gestational Age: <None> Type of Delivery: 8/23 1119 female Catherine Reeves) Vaginal 1st degree perineal lac,                              Birth weight - 7-8.5 oz ,D/C weight = 7-5.6 oz                               Per mom 1st Dr. Noland Fordyce 8/25 = 7.0 oz                                2nd Dr. Demetrius Charity per mom 8/27 = 6-14 oz  Per mom milk came in Monday to Tuesday 8/26-8/27 , Left breast seems fuller than Right and more swallows heard. Permom breast don't seem overly full. Mom also mentioned during breast slightly got bigger, more changes compared to my 1st 2 babies. ( 1st baby pumped for 5 months, and 2nd 1 1/2 months ) .   Breastfeeding History/   [facility] and [facility] at [facility] consult for a feeding assessment - last feed @ 1045 for 1 hour per mom  Frequency of Breastfeeding: per mom 8-10 X's in 24 hours  Length of Feeding: 45-60 mins  Voids: >8 in 24 hours  Stools: non stool in 48 hours ( last stool Tues early am 330 am - brownish -2 tablespoons)   Supplementing / Method: Pumping:  Type of Pump:DEBP Medela    Frequency: 2X's in the last 24 hours after feeding   Volume: last than 1/2 oz    Comments: Per mom was told by the Dr.to supplement , per mom has supplemented X's 3 ( 10 ml of EBM x2 and 15 ml formula x1 ) since Dr.'s visit.     Consultation Evaluation:Bilaterally nipples appear pinky red, per mom left tender compared to right .   Initial Feeding Assessment:RIGHT breast  Pre-feed Weight:6-15.2 oz 3152g  Post-feed Weight:6-15.2 oz 3152g  Amount Transferred: ZERO  Comments:Infant latched well in cross cradle position( , worked on depth , per mom comfortable with latch ) ,infant fed for 15-20 mins in a intermittent sluggish pattern and with stimulation was able to get back into a pattern, ( pointed out to mom swallows ) . Also had mom due breast compressions  intermittently and noted increased swallows.      LC noted many swallows , no gulps and not enough to tip the scale.   Additional Feeding Assessment: RIGHT breast  Pre-feed Weight:6-15.2 oz ( 3152g)  Post-feed Weight:6-15.8 oz ( 3166g)  Amount Transferred:14 ml  Comments:Infant latched well in the cross cradle position , consistent pattern with multiply swallows and gulps. Worked with mom to obtain the depth by tickle infants upper lip and wait for the mouth to open wide and latch. Fed for 20 mins   Additional Feeding Assessment: Back on to left in football position for 7 mins  Pre-feed Weight:6-15.8 oz ( 3166g)  Post-feed Weight:6-15.8 oz ( 3166g)  Amount Transferred:Zero  Comments:after th infant fed for a snack of 7 mins and weight , relatched, with hiccups , with same weight   Total Breast milk Transferred this Visit: 14 ml  Total Supplement Given: encouraged mom to do so Expressed milk  or formula ( offered mom formula at consult and she declined, per mom planned to go home and pump.                 LC increased flangy size to #27 , per mom the @24  seems small and feel tight when pumping. Also gave mom comfort gels , and a cup for feedings.   LACTATION PLAN OF CARE FOR HOME- 1) drink plenty flds , Nutritious meals and snacks ,including old fashion oatmeal in diet  naps and rest                                                                           2) Fenugreek handout for evidence based information to increase milk supply                                                                          3) Skin to skin feedings, prior to latch , breast massage , hand express prepump with hand pump to                                                                                Enhance  the flow  every 2-3 hours for 15 -20 mins                                                                                                                                                            1st breast ( and  only allow Catherine Reeves to stay if in  A consistent pattern . ( no hanging out at the breast ) , offer the 2nd breast                                                                           4) After feeding both breast cup feed 25 -30 ml of expressed milk or formula ( per mom per [facility] Dr.                                                                          5)  Extra pumping - with a DEBP - 4-6X's per day 10-15 mins                                                                           6) Feeding diary until Catherine Reeves comes up to birth weight                                                                           7) Call [facility] MD by 430 p today if the baby hasn't stooled ( it's been 48 + per mom )                                                                           8) Keep weight check for tomorrow    Follow-Up-    Per mom F/U weight check 8/30 with Dr. [name] @[facility]                         9/3 - LC recommended attending the Breast feeding Support group [facility]       [name] 01/12/2012, 1:25 PM

## 2014-02-09 IMAGING — US US OB DETAIL+14 WK
1 series · 12 of 28 positions shown · non-contrast
Comparison: none

[Series 1: us ob detail+14 wk · 0.23mm/px · 12 of 91 slices shown]
[im 4/91]
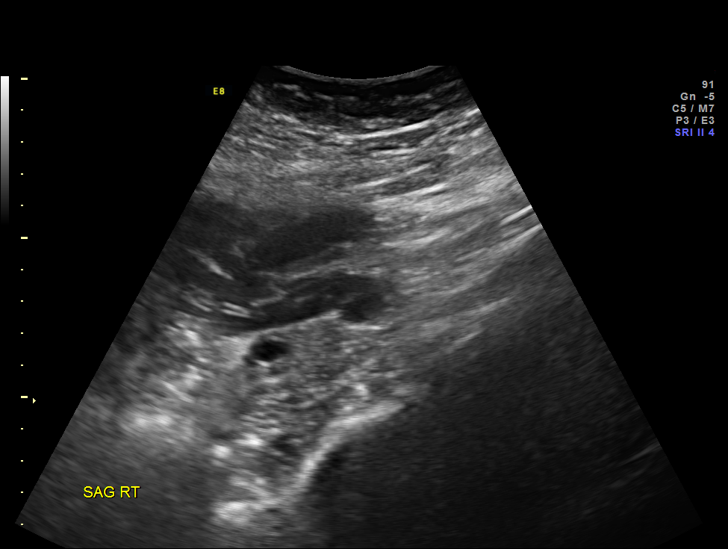
[im 11/91]
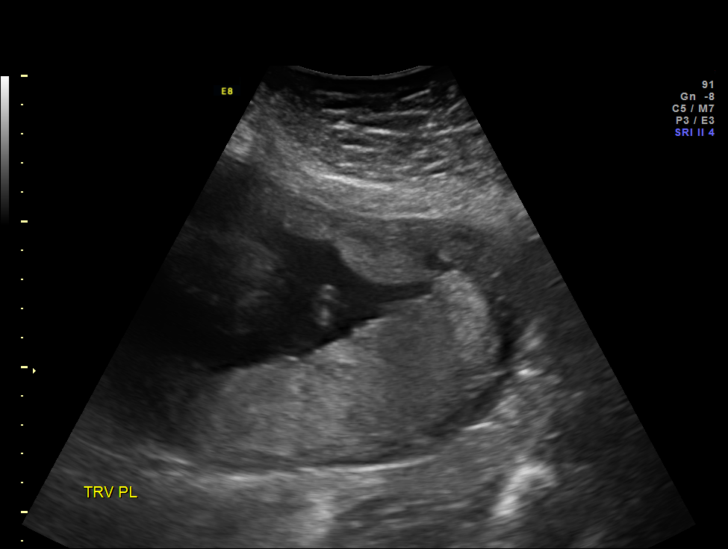
[im 17/91]
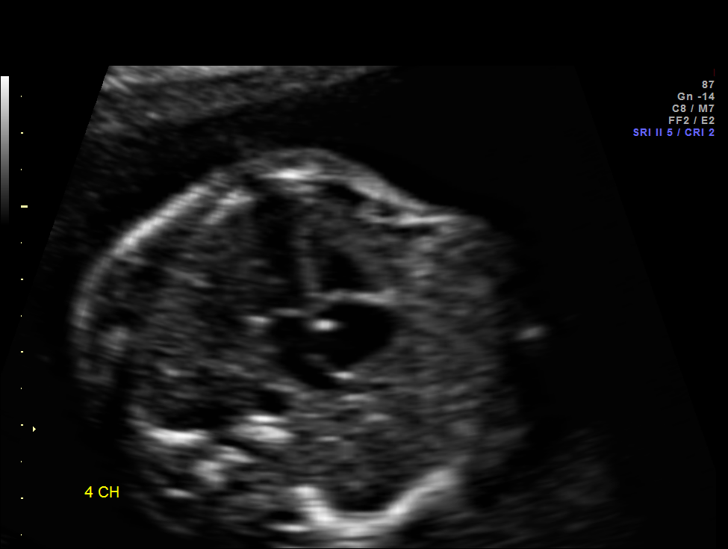
[im 27/91]
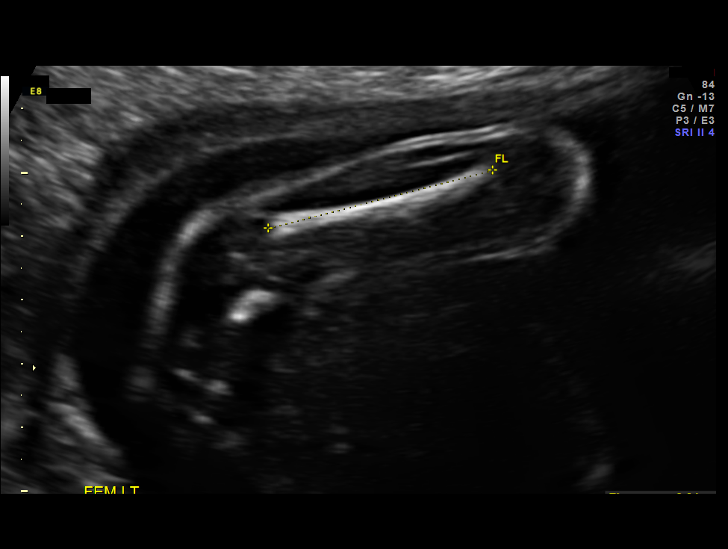
[im 34/91]
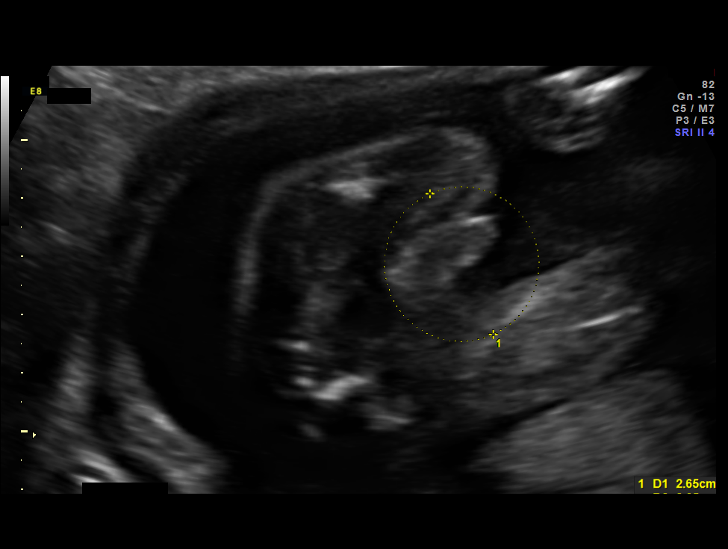
[im 41/91]
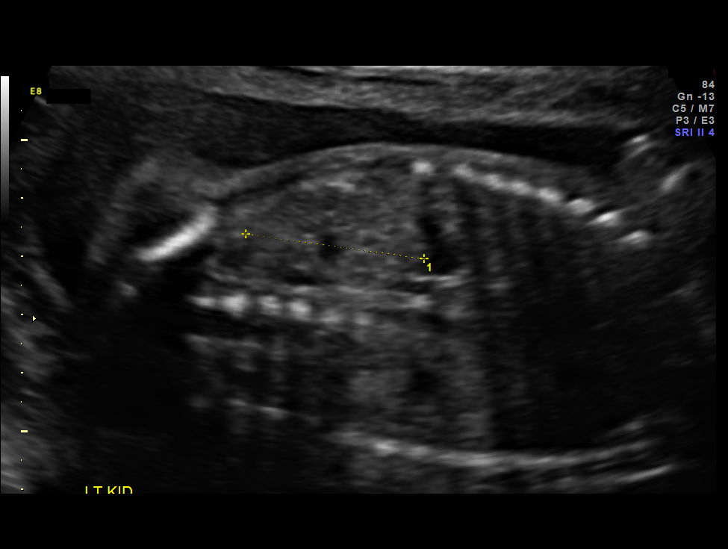
[im 51/91]
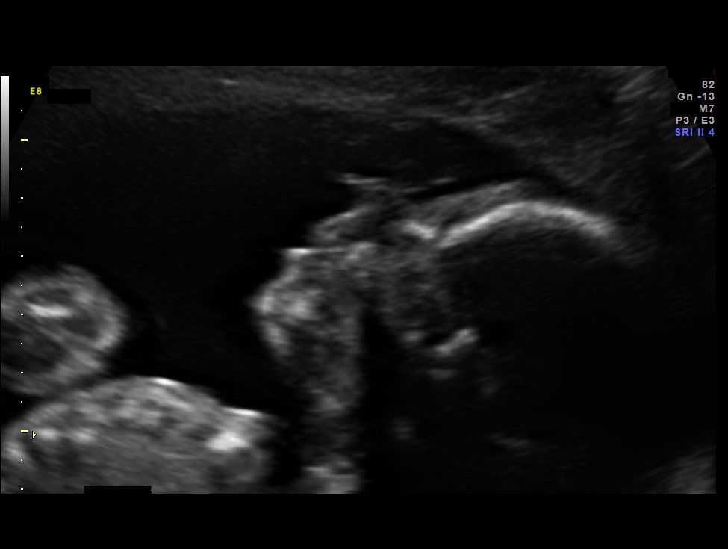
[im 57/91]
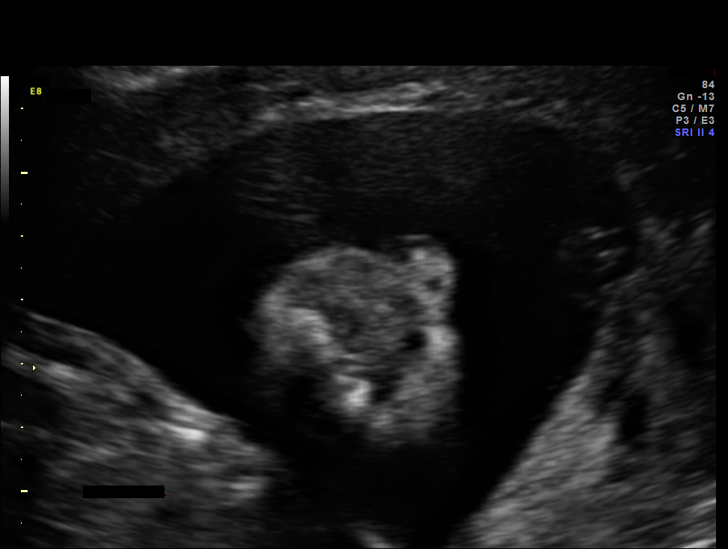
[im 64/91]
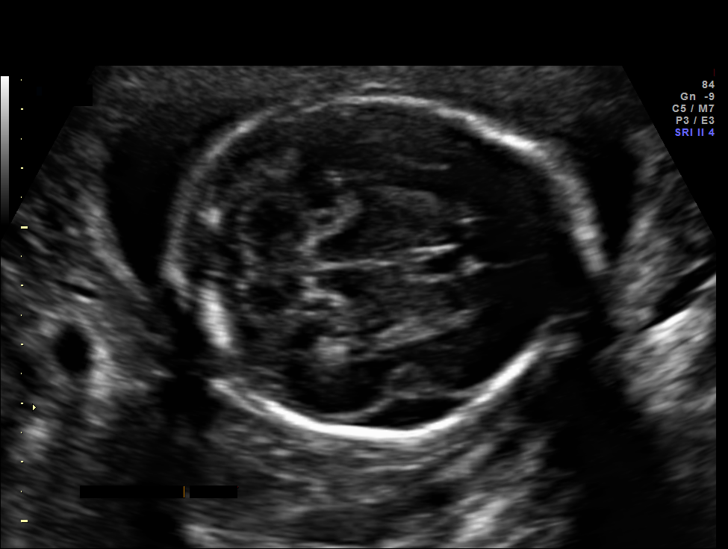
[im 74/91]
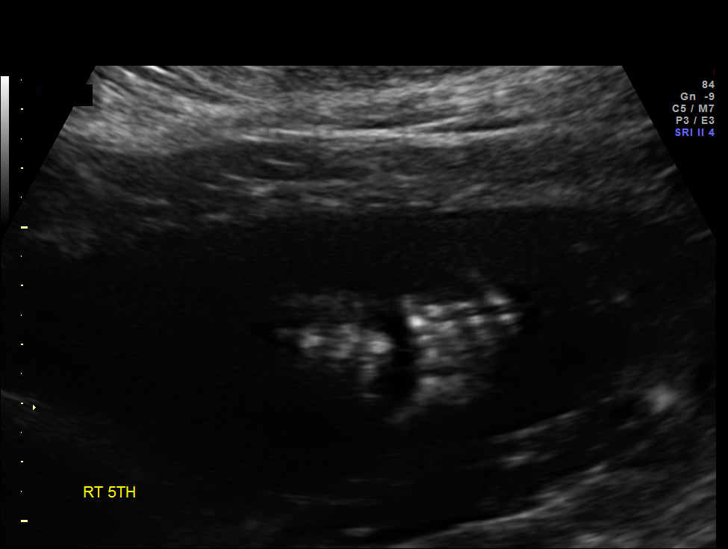
[im 81/91]
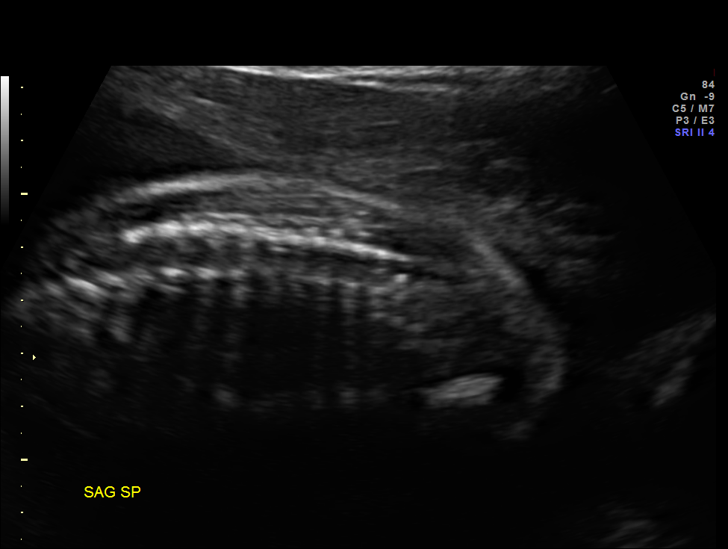
[im 87/91]
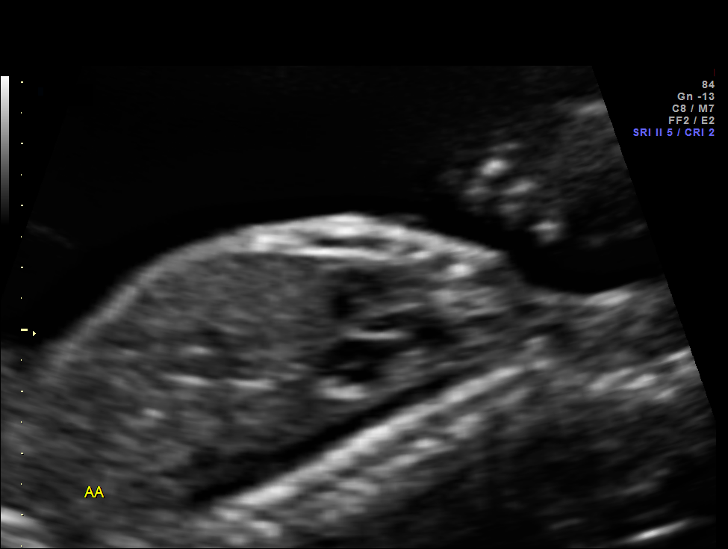

[12 of 28 positions shown; findings below may reference images not displayed]

OBSTETRICS REPORT
                      (Signed Final 09/09/2011 [DATE])

 Order#:         [id number]
Procedures

 US OB DETAIL + 14 WK                                  76811.0
Indications

 Detailed fetal anatomic survey
 Echogenic focus in heart (ECF)
 RH Negative
 Advanced maternal age (AMA), Multigravida
Fetal Evaluation

 Fetal Heart Rate:  152                          bpm
 Cardiac Activity:  Observed
 Presentation:      Cephalic
 Placenta:          Posterior, above cervical
                    os
 P. Cord            Visualized
 Insertion:

 Amniotic Fluid
 AFI FV:      Subjectively within normal limits
                                             Larg Pckt:     5.7  cm
Biometry

 BPD:     57.1  mm     G. Age:  23w 3d                CI:         77.8   70 - 86
 OFD:     73.4  mm                                    FL/HC:      18.1   19.2 -

 HC:     206.1  mm     G. Age:  22w 5d       25  %    HC/AC:      1.09   1.05 -

 AC:     188.6  mm     G. Age:  23w 5d       61  %    FL/BPD:     65.5   71 - 87
 FL:      37.4  mm     G. Age:  22w 0d       11  %    FL/AC:      19.8   20 - 24
 HUM:     36.3  mm     G. Age:  22w 5d       35  %
 CER:     25.9  mm     G. Age:  23w 6d       64  %

 Est. FW:     544  gm      1 lb 3 oz     52  %
Gestational Age

 LMP:           23w 0d        Date:  04/01/11                 EDD:   01/06/12
 U/S Today:     23w 0d                                        EDD:   01/06/12
 Best:          23w 0d     Det. By:  LMP  (04/01/11)          EDD:   01/06/12
Anatomy

 Cranium:           Appears normal      Aortic Arch:       Appears normal
 Fetal Cavum:       Appears normal      Ductal Arch:       Appears normal
 Ventricles:        Appears normal      Diaphragm:         Appears normal
 Choroid Plexus:    Appears normal      Stomach:           Appears
                                                           normal, left
                                                           sided
 Cerebellum:        Appears normal      Abdomen:           Appears normal
 Posterior Fossa:   Appears normal      Abdominal Wall:    Appears nml
                                                           (cord insert,
                                                           abd wall)
 Nuchal Fold:       Not applicable      Cord Vessels:      Appears normal
                    (>20 wks GA)                           (3 vessel cord)
 Face:              Appears normal      Kidneys:           Appear normal
                    (lips/profile/orbit
                    s)
 Heart:             Appears normal      Bladder:           Appears normal
                    (4 chamber &
                    axis)
 RVOT:              Appears normal      Spine:             Appears normal
 LVOT:              Appears normal      Limbs:             Appears normal
                                                           (hands, ankles,
                                                           feet)

 Other:     Fetus appears to be a male. Heels and 5th digit appears
            normal.
Targeted Anatomy

 Fetal Central Nervous System
 Cisterna Magna:
Cervix Uterus Adnexa

 Cervical Length:    4.2      cm

 Cervix:       Normal appearance by transabdominal scan. Appears
               closed, without funnelling.

 Left Ovary:    Within normal limits.
 Right Ovary:   Within normal limits.
Comments

 The patient's fetal anatomic survey is now complete.  No fetal
 anomalies or soft markers of aneuploidy were seen.  Results
 of scan discussed with patient, as well as the limitations of
 U/S to detect all fetal anomalies and aneuploidies.  No further
 ultrasounds are required unless additional problems arise.
Impression

 Single living intrauterine pregnancy at 23 weeks 0 days.
 Appropriate fetal growth (52%).
 Normal amniotic fluid volume.
 Normal fetal anatomy.
 No fetal anomalies or soft markers of aneuploidy seen.
Recommendations

 Follow-up ultrasounds as clinically indicated.
 questions or concerns.
                Jemison, Abigael

## 2014-03-17 ENCOUNTER — Encounter (HOSPITAL_COMMUNITY): Payer: Self-pay

## 2019-01-22 ENCOUNTER — Telehealth: Payer: Self-pay | Admitting: Gastroenterology

## 2019-01-22 NOTE — Telephone Encounter (Signed)
[name] (Path MD) called me about her to expedite a visit.  She was in [place] over the weekend with abd pain, vomiting.    Claiborne Billings, Can I see her tomorrow (Wednesday) at 1:30 (currently I have 0 patients shceduled for [facility] tomorrow afternoon.  Thanks

## 2019-01-22 NOTE — Telephone Encounter (Signed)
Great, thanks

## 2019-01-22 NOTE — Telephone Encounter (Signed)
I spoke to patient this morning, she is on schedule to see you in office at 1:30 pm 01/23/19

## 2019-01-23 ENCOUNTER — Ambulatory Visit (INDEPENDENT_AMBULATORY_CARE_PROVIDER_SITE_OTHER): Payer: PRIVATE HEALTH INSURANCE | Admitting: Gastroenterology

## 2019-01-23 ENCOUNTER — Encounter: Payer: Self-pay | Admitting: Gastroenterology

## 2019-01-23 VITALS — BP 100/70 | HR 84 | Temp 97.0°F | Ht 70.0 in | Wt 233.8 lb

## 2019-01-23 DIAGNOSIS — R1013 Epigastric pain: Secondary | ICD-10-CM | POA: Diagnosis not present

## 2019-01-23 MED ORDER — OMEPRAZOLE 40 MG PO CPDR: 40.0000 mg | DELAYED_RELEASE_CAPSULE | Freq: Two times a day (BID) | ORAL | 3 refills | Status: AC

## 2019-01-23 NOTE — Progress Notes (Signed)
HPI: This is a very pleasant 43 year old woman whom I am meeting for the first time today.  She is somehow connected with Dr. Colonel BaldKish who is a pathologist here in town and he called me to see if I can get her fitted in after a recent emergency room visit in [place].  She has had epigastric and upper back pain for the past 2 to 3 weeks.  Eating clearly makes it worse.  She has not had any nausea or vomiting.  She says she has had some very minor chills and a measured temperature at 99.0 Fahrenheit.  She gets headaches periodically.  She tells me she has been taking Motrin up to 2400 mg daily for the past 3 weeks.  She started taking this for the back and epigastric pain.   The pains are not positional  Her weight has been overall stable.  She has been avoiding food because it makes the pains worse.  Chief complaint is epigastric and upper back pain    Old Data Reviewed: Blood work in the emergency room September 2020 shows a normal CBC (slight "left shift with 82% neutrophils), normal complete metabolic profile except for an AST of 46 and potassium of 3.6, HCG negative  September 2020 "gallbladder ultrasound" shows "normal right upper quadrant ultrasound.  No evidence of cholelithiasis or acute cholecystitis"   Review of systems: Pertinent positive and negative review of systems were noted in the above HPI section. All other review negative.   Past Medical History:  Diagnosis Date  . No pertinent past medical history     Past Surgical History:  Procedure Laterality Date  . jaw surgery, correction    . TONSILLECTOMY AND ADENOIDECTOMY    . TYMPANOSTOMY TUBE PLACEMENT     B/L X 3 sets    Current Outpatient Medications  Medication Sig Dispense Refill  . Probiotic Product (PROBIOTIC PO) Take 1 capsule by mouth daily.     No current facility-administered medications for this visit.     Allergies as of 01/23/2019  . (No Known Allergies)    Family History  Problem Relation  Age of Onset  . Migraines Mother   . Colon polyps Father        ? Possibly  . Migraines Sister   . Migraines Brother   . Thyroid disease Maternal Grandmother   . Hypertension Maternal Grandmother   . Anxiety disorder Maternal Grandmother   . Dementia Maternal Grandmother   . Arthritis Maternal Grandfather   . Hypertension Maternal Grandfather   . Other Maternal Grandfather        Complications from THR  . Cancer Paternal Grandfather        Hodgkin's Lymphoma  . Colon polyps Paternal Grandfather     Social History   Socioeconomic History  . Marital status: Married    Spouse name: Not on file  . Number of children: Not on file  . Years of education: Not on file  . Highest education level: Not on file  Occupational History  . Not on file  Social Needs  . Financial resource strain: Not on file  . Food insecurity    Worry: Not on file    Inability: Not on file  . Transportation needs    Medical: Not on file    Non-medical: Not on file  Tobacco Use  . Smoking status: Never Smoker  . Smokeless tobacco: Never Used  Substance and Sexual Activity  . Alcohol use: Yes    Alcohol/week: 0.0  standard drinks    Comment: rare, special occasions  . Drug use: No  . Sexual activity: Not on file  Lifestyle  . Physical activity    Days per week: Not on file    Minutes per session: Not on file  . Stress: Not on file  Relationships  . Social Herbalist on phone: Not on file    Gets together: Not on file    Attends religious service: Not on file    Active member of club or organization: Not on file    Attends meetings of clubs or organizations: Not on file    Relationship status: Not on file  . Intimate partner violence    Fear of current or ex partner: Not on file    Emotionally abused: Not on file    Physically abused: Not on file    Forced sexual activity: Not on file  Other Topics Concern  . Not on file  Social History Narrative  . Not on file     Physical  Exam: BP 100/70   Pulse 84   Temp (!) 97 F (36.1 C)   Ht 5\' 10"  (1.778 m)   Wt 233 lb 12.8 oz (106.1 kg)   BMI 33.55 kg/m  Constitutional: generally well-appearing Psychiatric: alert and oriented x3 Eyes: extraocular movements intact Mouth: oral pharynx moist, no lesions Neck: supple no lymphadenopathy Cardiovascular: heart regular rate and rhythm Lungs: clear to auscultation bilaterally Abdomen: soft, nontender, nondistended, no obvious ascites, no peritoneal signs, normal bowel sounds Extremities: no lower extremity edema bilaterally Skin: no lesions on visible extremities   Assessment and plan: 43 y.o. female with epigastric and upper back pain, postprandial  Possibly gastric, possibly biliary.  She did have a "gallbladder ultrasound" in [facility] emergency room and it showed no gallstones.  She has been taking quite a lot of Motrin on a daily basis but she said she started this because of the pain not prior to the pain.  Either way 2400 mg of ibuprofen is an extreme amount and it could certainly be exacerbating things.  Recommended strongly that she stop completely and instead take Tylenol as needed for pains.  She was given 20 mg of Prilosec by the emergency room [place].  I am going to call her in 40 mg pills and I have instructed her to take 1 pill twice daily for now.  We will start her work-up with CT scan abdomen pelvis she understands if that is not helpful that she might need further testing such as an upper endoscopy or further imaging.   Please see the "Patient Instructions" section for addition details about the plan.   Owens Loffler, MD [facility] 01/23/2019, 1:50 PM  Cc: No ref. provider found

## 2019-01-23 NOTE — Patient Instructions (Signed)
You have been scheduled for a CT scan of the abdomen and pelvis at [facility] ([address]---this is in the same building as [facility]).   You are scheduled on 02/13/19 at 3pm. You should arrive 15 minutes prior to your appointment time for registration. Please follow the written instructions below on the day of your exam:  WARNING: IF YOU ARE ALLERGIC TO IODINE/X-RAY DYE, PLEASE NOTIFY RADIOLOGY IMMEDIATELY AT [phone]! YOU WILL BE GIVEN A 13 HOUR PREMEDICATION PREP.  1) Do not eat or drink anything after 11am (4 hours prior to your test) 2) You have been given 2 bottles of oral contrast to drink. The solution may taste better if refrigerated, but do NOT add ice or any other liquid to this solution. Shake well before drinking.    Drink 1 bottle of contrast @ 1pm (2 hours prior to your exam)  Drink 1 bottle of contrast @ 2pm (1 hour prior to your exam)  You may take any medications as prescribed with a small amount of water, if necessary. If you take any of the following medications: METFORMIN, GLUCOPHAGE, GLUCOVANCE, AVANDAMET, RIOMET, FORTAMET, [place] MET, JANUMET, GLUMETZA or METAGLIP, you MAY be asked to HOLD this medication 48 hours AFTER the exam.  The purpose of you drinking the oral contrast is to aid in the visualization of your intestinal tract. The contrast solution may cause some diarrhea. Depending on your individual set of symptoms, you may also receive an intravenous injection of x-ray contrast/dye. Plan on being at [facility] for 30 minutes or longer, depending on the type of exam you are having performed.  This test typically takes 30-45 minutes to complete.  If you have any questions regarding your exam or if you need to reschedule, you may call the CT department at [phone] between the hours of 8:00 am and 5:00 pm, Monday-Friday.   We have sent the following medications to your pharmacy for you to pick up at your  convenience: Omeprazole  Avoid NSAIDS   ________________________________________________________________________

## 2019-02-08 ENCOUNTER — Telehealth: Payer: Self-pay

## 2019-02-08 NOTE — Telephone Encounter (Signed)
-----   Message from Darden Dates sent at 02/08/2019  2:37 PM EDT ----- Thanks for getting that to him Vivien Rota. Erline Levine, can you update the order? ----- Message ----- From: Elias Else, CMA Sent: 02/08/2019   1:48 PM EDT To: Darden Dates   ----- Message ----- From: Milus Banister, MD Sent: 02/08/2019  12:10 PM EDT To: Elias Else, CMA  OK to just do the CT abdomen  Htanks ----- Message ----- From: Elias Else, CMA Sent: 02/08/2019  11:57 AM EDT To: Milus Banister, MD  Please advise!  Thank you,  Vivien Rota  ----- Message ----- From: Darden Dates Sent: 02/08/2019  10:05 AM EDT To: Angie Fava, LPN  Willodean Rosenthal, Dr. Ardis Hughs ordered a CT abd/pel.  The note definitely supports the abdomen, but there isn't anything that I can use to support the pelvis.  I checked with Erline Levine at [facility] and she agreed that we need another diagnosis to support the pelvis. Can you help me out or check with Dr. Ardis Hughs when you have a sec. Thanks, Amy

## 2019-02-13 ENCOUNTER — Other Ambulatory Visit: Payer: Self-pay

## 2019-02-13 ENCOUNTER — Ambulatory Visit (INDEPENDENT_AMBULATORY_CARE_PROVIDER_SITE_OTHER)
Admission: RE | Admit: 2019-02-13 | Discharge: 2019-02-13 | Disposition: A | Discharge status: Home / Self Care | Payer: Self-pay | Source: Ambulatory Visit | Attending: Gastroenterology | Admitting: Gastroenterology

## 2019-02-13 DIAGNOSIS — R1013 Epigastric pain: Secondary | ICD-10-CM

## 2019-02-13 MED ORDER — IOHEXOL 300 MG/ML  SOLN
100.0000 mL | Freq: Once | INTRAMUSCULAR | Status: AC | PRN
  Administered 2019-02-13: 100 mL via INTRAVENOUS

## 2020-03-04 ENCOUNTER — Emergency Department
Admission: RE | Admit: 2020-03-04 | Discharge: 2020-03-04 | Disposition: A | Discharge status: Home / Self Care | Payer: PRIVATE HEALTH INSURANCE | Source: Ambulatory Visit

## 2020-03-04 ENCOUNTER — Other Ambulatory Visit: Payer: Self-pay

## 2020-03-04 ENCOUNTER — Emergency Department (INDEPENDENT_AMBULATORY_CARE_PROVIDER_SITE_OTHER): Payer: PRIVATE HEALTH INSURANCE

## 2020-03-04 VITALS — BP 131/82 | HR 98 | Temp 97.5°F | Resp 17

## 2020-03-04 DIAGNOSIS — D72829 Elevated white blood cell count, unspecified: Secondary | ICD-10-CM | POA: Diagnosis not present

## 2020-03-04 DIAGNOSIS — R109 Unspecified abdominal pain: Secondary | ICD-10-CM

## 2020-03-04 DIAGNOSIS — R1084 Generalized abdominal pain: Secondary | ICD-10-CM | POA: Diagnosis not present

## 2020-03-04 LAB — POCT CBC W AUTO DIFF (K'VILLE URGENT CARE)

## 2020-03-04 LAB — POCT URINALYSIS DIP (MANUAL ENTRY)
Bilirubin, UA: NEGATIVE
Glucose, UA: NEGATIVE mg/dL
Ketones, POC UA: NEGATIVE mg/dL
Leukocytes, UA: NEGATIVE
Nitrite, UA: NEGATIVE
Protein Ur, POC: NEGATIVE mg/dL
Spec Grav, UA: 1.015 (ref 1.010–1.025)
Urobilinogen, UA: 0.2 E.U./dL
pH, UA: 5.5 (ref 5.0–8.0)

## 2020-03-04 NOTE — Discharge Instructions (Addendum)
Recommend follow-up in the setting of the emergency department as your white count is elevated here in clinic today.  Your abdominal x-ray does not show any evidence of constipation or bowel obstruction obstruction however given the pain is localized to your right side of her abdomen I am concerned for possible gallbladder versus appendix related conditions as the source of your symptoms.  Recommend going to [facility] for immediate work-up and evaluation of your symptoms.

## 2020-03-04 NOTE — ED Triage Notes (Signed)
Pt c/o RT sided adb pain. Started last night. Constant dull ache, sometimes sharp depending on movement or if palpates in area. Denies N/V/D. No hx of kidney stones. Still had appendix/gall bladder.

## 2020-03-04 NOTE — ED Notes (Signed)
Patient is being discharged from the Urgent Care and sent to the Emergency Department via POV. Per Jerrilyn Cairo FNP, patient is in need of higher level of care due to abd pain and possibility of appendicitis or cholecystitis. Patient is aware and verbalizes understanding of plan of care.  Vitals:   03/04/20 1256  BP: 131/82  Pulse: 98  Resp: 17  Temp: (!) 97.5 F (36.4 C)  SpO2: 97%

## 2020-03-04 NOTE — ED Provider Notes (Signed)
[facility]    CSN: [id number] Arrival date & time: 03/04/20  1242      History   Chief Complaint Chief Complaint  Patient presents with  . Abdominal Pain    RT SIDE    HPI Catherine Reeves is a 44 y.o. female.   HPI  Catherine Reeves presents with symptoms right flank and right sided abdominal pain. Onset of symptoms:x 1 night . Patient has a history of some recurrent abdominal pain which has previously occurred in the epigastric region.  She has been seen previously by GI and had unremarkable work-up.  She reports the pain that she is experienced currently is different.  She also had her gallbladder and her appendix.  Denies any fever.  Reports recurrent abnormal production of stool as she produces a small scant amount at times and has had a bowel movement today.  Medical history significant for IBS.  She is also able to produce gas.  Denies any vomitus.  Denies any dysuria or history of renal stones.  She endorses that the pain is constant and dull and occasionally sharp.  She has not attempted relief of pain with any OTC medication.  She also has had a previous CT of the abdomen completed which was normal.  She is also had a gallbladder study approximately a year ago which was also normal.  Past Medical History:  Diagnosis Date  . No pertinent past medical history     Patient Active Problem List   Diagnosis Date Noted  . OVERWEIGHT 06/25/2010  . GERD 06/25/2010  . ABDOMINAL PAIN, EPIGASTRIC 06/25/2010  . ABDOMINAL PAIN OTHER SPECIFIED SITE 06/25/2010  . OTITIS MEDIA, BILATERAL, HX OF 06/25/2010  . ANEMIA, HX OF 06/25/2010  . UNSPEC HEMORRHOIDS WITHOUT MENTION COMPLICATION 06/25/2010  . VIRAL URI 06/25/2010  . ELEVATED BP READING WITHOUT DX HYPERTENSION 06/25/2010  . IRRITABLE BOWEL SYNDROME, HX OF 06/25/2010    Past Surgical History:  Procedure Laterality Date  . jaw surgery, correction    . TONSILLECTOMY AND ADENOIDECTOMY    . TYMPANOSTOMY TUBE PLACEMENT       B/L X 3 sets    OB History    Gravida  3   Para  3   Term  3   Preterm  0   AB  0   Living  3     SAB  0   TAB  0   Ectopic  0   Multiple  0   Live Births  1            Home Medications    Prior to Admission medications   Medication Sig Start Date End Date Taking? Authorizing Provider  omeprazole (PRILOSEC) 40 MG capsule Take 1 capsule (40 mg total) by mouth 2 (two) times daily. 01/23/19   Rachael Fee, MD  Probiotic Product (PROBIOTIC PO) Take 1 capsule by mouth daily.    [provider]    Family History Family History  Problem Relation Age of Onset  . Migraines Mother   . Colon polyps Father        ? Possibly  . Migraines Sister   . Migraines Brother   . Thyroid disease Maternal Grandmother   . Hypertension Maternal Grandmother   . Anxiety disorder Maternal Grandmother   . Dementia Maternal Grandmother   . Arthritis Maternal Grandfather   . Hypertension Maternal Grandfather   . Other Maternal Grandfather        Complications from THR  . Cancer  Paternal Grandfather        Hodgkin's Lymphoma  . Colon polyps Paternal Grandfather     Social History Social History   Tobacco Use  . Smoking status: Never Smoker  . Smokeless tobacco: Never Used  Substance Use Topics  . Alcohol use: Yes    Alcohol/week: 0.0 standard drinks    Comment: rare, special occasions  . Drug use: No     Allergies   Patient has no known allergies. Review of Systems Review of Systems Pertinent negatives listed in HPI   Physical Exam Triage Vital Signs ED Triage Vitals  Enc Vitals Group     BP 03/04/20 1256 131/82     Pulse Rate 03/04/20 1256 98     Resp 03/04/20 1256 17     Temp 03/04/20 1256 (!) 97.5 F (36.4 C)     Temp Source 03/04/20 1256 Oral     SpO2 03/04/20 1256 97 %     Weight --      Height --      Head Circumference --      Peak Flow --      Pain Score 03/04/20 1259 3     Pain Loc --      Pain Edu? --      Excl. in GC? --     No data found.  Updated Vital Signs BP 131/82 (BP Location: Left Arm)   Pulse 98   Temp (!) 97.5 F (36.4 C) (Oral)   Resp 17   LMP 02/12/2020 (Exact Date)   SpO2 97%   Visual Acuity Right Eye Distance:   Left Eye Distance:   Bilateral Distance:    Right Eye Near:   Left Eye Near:    Bilateral Near:     Physical Exam Constitutional:      General: She is not in acute distress.    Appearance: She is obese. She is not ill-appearing or toxic-appearing.  Cardiovascular:     Rate and Rhythm: Normal rate and regular rhythm.  Pulmonary:     Effort: Pulmonary effort is normal.     Breath sounds: Normal breath sounds and air entry.  Abdominal:     General: Abdomen is protuberant.     Palpations: Abdomen is soft.     Tenderness: There is abdominal tenderness in the right upper quadrant and right lower quadrant. There is right CVA tenderness.    Musculoskeletal:     Right lower leg: No edema.     Left lower leg: No edema.  Skin:    General: Skin is warm.     Capillary Refill: Capillary refill takes less than 2 seconds.  Psychiatric:        Attention and Perception: Attention normal.        Mood and Affect: Affect is flat.        Speech: Speech normal.      UC Treatments / Results  Labs (all labs ordered are listed, but only abnormal results are displayed) Labs Reviewed  POCT URINALYSIS DIP (MANUAL ENTRY) - Abnormal; Notable for the following components:      Result Value   Blood, UA trace-lysed (*)    All other components within normal limits  POCT CBC W AUTO DIFF ([facility])    EKG   Radiology DG Abd 2 Views  Result Date: 03/04/2020 CLINICAL DATA:  Right-sided abdominal pain. EXAM: ABDOMEN - 2 VIEW COMPARISON:  None. FINDINGS: The bowel gas pattern is normal. There is no evidence of  free air. No evidence of radio-opaque calculi, although bowel gas limits evaluation (particularly on the right). Rounded radiopaque density in the inferolateral right  anatomic pelvis is favored to reflect a calcified phlebolith. Transitional lumbosacral anatomy. Visualized lung bases are unremarkable. IMPRESSION: 1. Nonobstructive bowel gas pattern. 2. No evidence of radiopaque calculi, although overlapping bowel gas limits evaluation. Electronically Signed   By: Feliberto Harts MD   On: 03/04/2020 14:04    Procedures Procedures (including critical care time)  Medications Ordered in UC Medications - No data to display  Initial Impression / Assessment and Plan / UC Course  I have reviewed the triage vital signs and the nursing notes.  Pertinent labs & imaging results that were available during my care of the patient were reviewed by me and considered in my medical decision making (see chart for details).    CBC significant today for mild leukocytosis with a WBC of 14.  Imaging of the abdomen did not reveal any constipation.  Given these findings along with exam findings and patient reports of sharp pain recommended ER evaluation.  Patient requesting an antibiotic advised that antibiotics are not indicated when there is identifiable infection and indication to treat with an antibiotic.  Advised that there is no known cause for her symptoms per my work-up today I recommend that she is evaluated in the closest ER.  Patient subsequently called back following discharge inquiring about if she had to go to the ER advised nursing staff to communicate with patient per my discharge instructions and information written on her discharge paperwork it is advised that she goes to the ER for further work-up as given limitations in the urgent care setting there was no identifiable cause for her symptoms today and given this involves her abdomen and could be related to gallbladder versus appendix she warrants further work-up in the ER. Final Clinical Impressions(s) / UC Diagnoses   Final diagnoses:  Abdominal pain  Leukocytosis, unspecified type     Discharge Instructions      Recommend follow-up in the setting of the emergency department as your white count is elevated here in clinic today.  Your abdominal x-ray does not show any evidence of constipation or bowel obstruction obstruction however given the pain is localized to your right side of her abdomen I am concerned for possible gallbladder versus appendix related conditions as the source of your symptoms.  Recommend going to [facility] for immediate work-up and evaluation of your symptoms.    ED Prescriptions    None     PDMP not reviewed this encounter.   Bing [place]ighbors, [place] 03/05/20 [phone]
